# Patient Record
Sex: Male | Born: 1955 | Race: White | Hispanic: No | Marital: Married | Smoking: Current every day smoker
Health system: Southern US, Community
[De-identification: ages and names within clinical notes are randomized; demographics above are authoritative.]

## PROBLEM LIST (undated history)

## (undated) DIAGNOSIS — G43909 Migraine, unspecified, not intractable, without status migrainosus: Secondary | ICD-10-CM

## (undated) DIAGNOSIS — B019 Varicella without complication: Secondary | ICD-10-CM

## (undated) HISTORY — DX: Varicella without complication: B01.9

## (undated) HISTORY — PX: APPENDECTOMY: SHX54

## (undated) HISTORY — DX: Migraine, unspecified, not intractable, without status migrainosus: G43.909

## (undated) HISTORY — PX: INNER EAR SURGERY: SHX679

---

## 2016-04-27 HISTORY — PX: CATARACT EXTRACTION: SUR2

## 2018-02-04 ENCOUNTER — Telehealth: Payer: Self-pay

## 2018-02-04 NOTE — Telephone Encounter (Signed)
Copied from CRM [id number]. Topic: Appointment Scheduling - Scheduling Inquiry for Clinic >> Feb 04, 2018  9:13 AM Jeff Sheppard wrote: Reason for CRM: Jeff Sheppard, pt of Dr. Patsy Lager, is wanting to know if Jeff Sheppard can establish care with Dr. Patsy Lager as well. She states he has not had a doctor in 30 years other than cataract surgery last year. He is needing to establish a PCP. Non MCR pt as he is still working but she cannot remember insurance provider. Please advise.

## 2018-02-04 NOTE — Telephone Encounter (Signed)
Please advise 

## 2018-02-04 NOTE — Telephone Encounter (Signed)
Please schedule NP appt at his convenience. Thank you.  

## 2018-02-04 NOTE — Telephone Encounter (Signed)
Yes, I will see him.

## 2018-02-07 NOTE — Telephone Encounter (Signed)
Pt scheduled 05/26/18 @ 9:30am

## 2018-05-21 NOTE — Progress Notes (Addendum)
[facility] [address], [address] [place], [place] [id number] [phone] [phone]  Date:  05/26/2018   Name:  Jeff Sheppard   DOB:  1956-04-25   MRN:  [id number]  PCP:  Pearline Cablesopland, Jailee Jaquez C, MD    Chief Complaint: New Patient (Initial Visit) (left side pain,left  arm numbness, ekg normal at ER,  limited ROM, started in october)   History of Present Illness:  Jeff Sheppard is a 63 y.o. very pleasant male patient who presents with the following:  Here today as a new patient.  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  He was at work. He was not having slurred speech.  He did not notice any other symptoms, but the left arm numbness continued for some days.  Since then he has noted some difficulty in moving and lifting his arm 2 weeks ago he was having "a migraine"- he had a bad HA and was nauseated. He went to work; he complained of his left arm being numb.  His work called EMS and they did an EKG; he declined to go to the hospital however at that time  Today he notes that his left arm has been numb and somewhat weak since this past October  He has not noted any other neurologic symptoms, no slurred speech or facial  drooping.  He is not aware of any family history of stroke He does smoke; has smoked since he was a child, about a pack per day since he was maybe 63 yo per his report   There are no active problems to display for this patient.   Past Medical History:  Diagnosis Date  . Chicken pox   . Migraines     Past Surgical History:  Procedure Laterality Date  . APPENDECTOMY     30 years ago  . CATARACT EXTRACTION  2018  . INNER EAR SURGERY     pt states he could not here    Social History   Tobacco Use  . Smoking status: Current Every Day Smoker    Packs/day: 1.00    Types: Cigarettes  . Smokeless tobacco: Never Used  Substance Use Topics  . Alcohol use: Never    Frequency: Never  . Drug use: Never    Family History  Problem Relation Age of Onset  . Cancer Mother   . Breast cancer Mother   . Early death Mother     Not on File  Medication list has been reviewed and updated.  No current outpatient medications on file prior to visit.   No current facility-administered medications on  file prior to visit.     Review of Systems:  As per HPI- otherwise negative.   Physical Examination: Vitals:   05/26/18 0928  BP: 136/90  Pulse: 76  Resp: 16  Temp: 98.7 F (37.1 C)  SpO2: 97%   Vitals:   05/26/18 0928  Weight: 149 lb (67.6 kg)  Height: 5\' 7"  (1.702 m)   Body mass index is 23.34 kg/m. Ideal Body Weight: Weight in (lb) to have BMI = 25: 159.3  GEN: WDWN, NAD, Non-toxic, A & O x 3, looks well, normal weight HEENT: Atraumatic, Normocephalic. Neck supple. No masses, No LAD.  Bilateral TM wnl, oropharynx normal.  PEERL,EOMI.   No carotid bruit, he is missing many teeth Ears and Nose: No external deformity. CV: RRR, No M/G/R. No JVD. No thrill. No extra heart sounds. PULM: CTA B, no wheezes, crackles, rhonchi. No retractions. No resp. distress. No accessory muscle use. ABD: S, NT, ND EXTR: No c/c/e NEURO Normal gait.  Normal Romberg, tandem stance is  normal He notes normal strength and sensation of his face.  Deep tendon reflexes of bilateral biceps and patella are normal.  He has slightly decreased grip strength of the left hand compared with the right.  He does have loss of range of motion the left arm, but I am not sure if this is due to adhesive capsulitis.  I am not able to move his arm passively through full range of motion, he has restriction to abduction and flexion.  His sensation is normal in all limbs at this time [facility]: Normally interactive. Conversant. Not depressed or anxious appearing.  Calm demeanor.   EKG: Mild sinus bradycardia with rate of 56.  Right bundle branch block is present.  No old EKG for comparison available  Assessment and Plan: Left arm weakness - Plan: EKG 12-Lead, Tdap vaccine greater than or equal to 7yo IM  Screening for prostate cancer - Plan: PSA  Screening for colon cancer  Screening for diabetes mellitus - Plan: Comprehensive metabolic panel, Hemoglobin A1c  Screening for hyperlipidemia - Plan: Lipid panel  Screening for deficiency anemia - Plan: CBC  Immunization due  Jeff Sheppard is here today as a new patient.  He has not been to a doctor in many years.  He has been a smoker unfortunately since he was a young child, and has nearly a 60-pack-year history.  He describes an episode in October of last year, when his left arm became numb and weak.  He did not seek medical attention at that time.  He is still having difficulty moving his left arm, but at this point he may just have adhesive capsulitis.  This could have resulted from temporary weakness due to likely stroke.  Plan an MRI of his brain to determine if he had a stroke.  He is advised to seek attention if any other symptoms come up in the meantime. Encouraged him to quit smoking  Signed Abbe Amsterdam, MD  Received his labs as below-letter to patient  Results for orders placed or performed in visit on 05/26/18  CBC  Result Value Ref  Range   WBC 7.7 4.0 - 10.5 K/uL   RBC 5.43 4.22 - 5.81 Mil/uL   Platelets 252.0 150.0 - 400.0 K/uL   Hemoglobin 16.2 13.0 - 17.0 g/dL   HCT 00.9 38.1 - 82.9 %   MCV 88.4 78.0 - 100.0 fl   MCHC 33.8 30.0 - 36.0 g/dL   RDW 93.7 16.9 - 67.8 %  Comprehensive  metabolic panel  Result Value Ref Range   Sodium 139 135 - 145 mEq/L   Potassium 4.1 3.5 - 5.1 mEq/L   Chloride 104 96 - 112 mEq/L   CO2 27 19 - 32 mEq/L   Glucose, Bld 86 70 - 99 mg/dL   BUN 18 6 - 23 mg/dL   Creatinine, Ser 5.281.01 0.40 - 1.50 mg/dL   Total Bilirubin 0.4 0.2 - 1.2 mg/dL   Alkaline Phosphatase 53 39 - 117 U/L   AST 16 0 - 37 U/L   ALT 14 0 - 53 U/L   Total Protein 7.3 6.0 - 8.3 g/dL   Albumin 4.5 3.5 - 5.2 g/dL   Calcium 9.9 8.4 - 41.310.5 mg/dL   GFR 24.4074.56 >10.27>60.00 mL/min  Hemoglobin A1c  Result Value Ref Range   Hgb A1c MFr Bld 5.9 4.6 - 6.5 %  Lipid panel  Result Value Ref Range   Cholesterol 161 0 - 200 mg/dL   Triglycerides 25.348.0 0.0 - 149.0 mg/dL   HDL 66.4444.90 >03.47>39.00 mg/dL   VLDL 9.6 0.0 - 42.540.0 mg/dL   LDL Cholesterol 956106 (H) 0 - 99 mg/dL   Total CHOL/HDL Ratio 4    NonHDL 115.88   PSA  Result Value Ref Range   PSA 2.77 0.10 - 4.00 ng/mL   Called and spoke with his wife, I would suggest a cholesterol medication and baby aspirin.  Send in a prescription for simvastatin 20

## 2018-05-26 ENCOUNTER — Encounter: Payer: Self-pay | Admitting: Family Medicine

## 2018-05-26 ENCOUNTER — Ambulatory Visit (INDEPENDENT_AMBULATORY_CARE_PROVIDER_SITE_OTHER): Payer: PRIVATE HEALTH INSURANCE | Admitting: Family Medicine

## 2018-05-26 VITALS — BP 136/90 | HR 76 | Temp 98.7°F | Resp 16 | Ht 67.0 in | Wt 149.0 lb

## 2018-05-26 DIAGNOSIS — Z13220 Encounter for screening for lipoid disorders: Secondary | ICD-10-CM

## 2018-05-26 DIAGNOSIS — R29898 Other symptoms and signs involving the musculoskeletal system: Secondary | ICD-10-CM | POA: Diagnosis not present

## 2018-05-26 DIAGNOSIS — Z1211 Encounter for screening for malignant neoplasm of colon: Secondary | ICD-10-CM | POA: Diagnosis not present

## 2018-05-26 DIAGNOSIS — Z130 Encounter for screening for diseases of the blood and blood-forming organs and certain disorders involving the immune mechanism: Secondary | ICD-10-CM | POA: Diagnosis not present

## 2018-05-26 DIAGNOSIS — E785 Hyperlipidemia, unspecified: Secondary | ICD-10-CM

## 2018-05-26 DIAGNOSIS — Z125 Encounter for screening for malignant neoplasm of prostate: Secondary | ICD-10-CM

## 2018-05-26 DIAGNOSIS — Z23 Encounter for immunization: Secondary | ICD-10-CM

## 2018-05-26 DIAGNOSIS — Z131 Encounter for screening for diabetes mellitus: Secondary | ICD-10-CM | POA: Diagnosis not present

## 2018-05-26 LAB — CBC
HCT: 48 % (ref 39.0–52.0)
Hemoglobin: 16.2 g/dL (ref 13.0–17.0)
MCHC: 33.8 g/dL (ref 30.0–36.0)
MCV: 88.4 fl (ref 78.0–100.0)
Platelets: 252 10*3/uL (ref 150.0–400.0)
RBC: 5.43 Mil/uL (ref 4.22–5.81)
RDW: 13.6 % (ref 11.5–15.5)
WBC: 7.7 10*3/uL (ref 4.0–10.5)

## 2018-05-26 LAB — COMPREHENSIVE METABOLIC PANEL
ALT: 14 U/L (ref 0–53)
AST: 16 U/L (ref 0–37)
Albumin: 4.5 g/dL (ref 3.5–5.2)
Alkaline Phosphatase: 53 U/L (ref 39–117)
BUN: 18 mg/dL (ref 6–23)
CO2: 27 mEq/L (ref 19–32)
Calcium: 9.9 mg/dL (ref 8.4–10.5)
Chloride: 104 mEq/L (ref 96–112)
Creatinine, Ser: 1.01 mg/dL (ref 0.40–1.50)
GFR: 74.56 mL/min (ref >60.00)
GLUCOSE: 86 mg/dL (ref 70–99)
Potassium: 4.1 mEq/L (ref 3.5–5.1)
Sodium: 139 mEq/L (ref 135–145)
TOTAL PROTEIN: 7.3 g/dL (ref 6.0–8.3)
Total Bilirubin: 0.4 mg/dL (ref 0.2–1.2)

## 2018-05-26 LAB — PSA: PSA: 2.77 ng/mL (ref 0.10–4.00)

## 2018-05-26 LAB — LIPID PANEL
Cholesterol: 161 mg/dL (ref 0–200)
HDL: 44.9 mg/dL (ref >39.00)
LDL Cholesterol: 106 mg/dL — ABNORMAL HIGH (ref 0–99)
NonHDL: 115.88
Total CHOL/HDL Ratio: 4
Triglycerides: 48 mg/dL (ref 0.0–149.0)
VLDL: 9.6 mg/dL (ref 0.0–40.0)

## 2018-05-26 LAB — HEMOGLOBIN A1C: Hgb A1c MFr Bld: 5.9 % (ref 4.6–6.5)

## 2018-05-26 MED ORDER — SIMVASTATIN 20 MG PO TABS: 20.0000 mg | ORAL_TABLET | Freq: Every day | ORAL | 3 refills | Status: DC

## 2018-05-26 NOTE — Patient Instructions (Signed)
It was very nice to see you today, thank you for coming in for visit We will get routine labs for you today, and I will be in touch of these results You got a tetanus vaccine today We will set you up for colon cancer screening with a Cologuard test.  This will be sent to your home  I am concerned that your left arm weakness may represent a stroke.  We will arrange for an MRI of your brain to find out for sure.  This is important because if you had one stroke, we will try to prevent you from having another one that could be more serious  I would encourage you to quit smoking, smoking does increase your risk for heart attack or stroke

## 2018-05-26 NOTE — Addendum Note (Signed)
Addended by: Abbe Amsterdam C on: 05/26/2018 02:32 PM   Modules accepted: Orders

## 2018-06-06 ENCOUNTER — Other Ambulatory Visit: Payer: Self-pay | Admitting: Family Medicine

## 2018-06-07 ENCOUNTER — Telehealth: Payer: Self-pay | Admitting: Family Medicine

## 2018-06-07 ENCOUNTER — Ambulatory Visit
Admission: RE | Admit: 2018-06-07 | Discharge: 2018-06-07 | Disposition: A | Discharge status: Home / Self Care | Payer: PRIVATE HEALTH INSURANCE | Source: Ambulatory Visit | Attending: Family Medicine | Admitting: Family Medicine

## 2018-06-07 DIAGNOSIS — R29898 Other symptoms and signs involving the musculoskeletal system: Secondary | ICD-10-CM

## 2018-06-07 NOTE — Telephone Encounter (Signed)
I called [facility]-  He does have some sort of ear implant, they don't know further details about this.   May not be MRI safe Called and talked with Dr. Margo Aye, will change to a Ct head with and without contrast   Called and communicated this update with his wife [name]

## 2018-06-07 NOTE — Telephone Encounter (Signed)
Copied from CRM [id number]. Topic: Quick Communication - See Telephone Encounter >> Jun 07, 2018  8:05 AM Fanny Bien wrote: CRM for notification. See Telephone encounter for: 06/07/18. Pt wife called and stated that pt could not get MRI done because of the metal in his ears. Pt wife would like to know what they should do. Please advise

## 2018-06-14 ENCOUNTER — Other Ambulatory Visit: Payer: Self-pay | Admitting: Family Medicine

## 2018-06-17 ENCOUNTER — Ambulatory Visit
Admission: RE | Admit: 2018-06-17 | Discharge: 2018-06-17 | Disposition: A | Discharge status: Home / Self Care | Payer: PRIVATE HEALTH INSURANCE | Source: Ambulatory Visit | Attending: Family Medicine | Admitting: Family Medicine

## 2018-06-17 DIAGNOSIS — R29898 Other symptoms and signs involving the musculoskeletal system: Secondary | ICD-10-CM

## 2018-06-17 MED ORDER — IOPAMIDOL (ISOVUE-300) INJECTION 61%
75.0000 mL | Freq: Once | INTRAVENOUS | Status: AC | PRN
  Administered 2018-06-17: 75 mL via INTRAVENOUS

## 2018-06-18 ENCOUNTER — Telehealth: Payer: Self-pay | Admitting: Family Medicine

## 2018-06-18 DIAGNOSIS — R29898 Other symptoms and signs involving the musculoskeletal system: Secondary | ICD-10-CM

## 2018-06-18 NOTE — Telephone Encounter (Signed)
Received his recent CT report, as follows  Ct Head W & Wo Contrast  Result Date: 06/17/2018 CLINICAL DATA:  Tinnitus, LEFT arm and face numbness. Suspect stroke 3 months ago. History of inner ear surgery. EXAM: CT HEAD WITHOUT AND WITH CONTRAST TECHNIQUE: Contiguous axial images were obtained from the base of the skull through the vertex without and with intravenous contrast CONTRAST:  26mL ISOVUE-300 IOPAMIDOL (ISOVUE-300) INJECTION 61% COMPARISON:  None. FINDINGS: BRAIN: No intraparenchymal hemorrhage, mass effect nor midline shift. No parenchymal brain volume loss for age. No hydrocephalus. No acute large vascular territory infarcts. No abnormal extra-axial fluid collections. Basal cisterns are patent. VASCULAR: Mild calcific atherosclerosis of the carotid siphons. SKULL: No skull fracture. No significant scalp soft tissue swelling. SINUSES/ORBITS: Trace paranasal sinus mucosal thickening. Mastoid air cells are well aerated.The included ocular globes and orbital contents are non-suspicious. Status post bilateral ocular lens implants. OTHER: Poor dentition. IMPRESSION: Negative CT HEAD with and without contrast for age. Electronically Signed   By: Awilda Metro M.D.   On: 06/17/2018 14:33   Called and discussed results with his wife, who answered the phone We were not able to get an MRI due to a questionable history of possible ear implant, but it is certainly good news that his CT was normal. Of note, there is no comment of any metallic matter in his brain on CT I will refer to neurology for further evaluation of possible previous stroke

## 2018-06-21 ENCOUNTER — Encounter: Payer: Self-pay | Admitting: Neurology

## 2018-08-01 ENCOUNTER — Telehealth: Payer: Self-pay | Admitting: *Deleted

## 2018-08-01 NOTE — Telephone Encounter (Signed)
Received Cologuard Order Cancellation: [id number]; order has been changed to Suspended for Inactivity, the order will be reactivated if patient returns their sample w/i 365 days of the Initial order. [employer] has made several attempts to reach patient by phone and letter unsuccessfully; forwarded to provider/SLS

## 2018-09-05 ENCOUNTER — Ambulatory Visit: Payer: PRIVATE HEALTH INSURANCE | Admitting: Neurology

## 2018-09-26 ENCOUNTER — Ambulatory Visit: Payer: PRIVATE HEALTH INSURANCE | Admitting: Neurology

## 2018-11-04 ENCOUNTER — Ambulatory Visit: Payer: PRIVATE HEALTH INSURANCE | Admitting: Neurology

## 2018-12-05 ENCOUNTER — Ambulatory Visit: Payer: PRIVATE HEALTH INSURANCE | Admitting: Neurology

## 2019-07-31 IMAGING — CT CT HEAD WO/W CM
1 of 2 series · 13 of 30 positions shown, 17 images · IV contrast (iopamidol)
Comparison: None.

CLINICAL DATA: Tinnitus, LEFT arm and face numbness. Suspect stroke
3 months ago. History of inner ear surgery.

EXAM:
CT HEAD WITHOUT AND WITH CONTRAST
TECHNIQUE: Contiguous axial images were obtained from the base of the skull
through the vertex without and with intravenous contrast
CONTRAST:  75mL XF0EOT-FII IOPAMIDOL (XF0EOT-FII) INJECTION 61%

[Series 2: head w/(date) · axial · 0.42mm/px · z∈[-170,-15]mm · 13 of 37 slices shown, 17 images]
[im 3/37  brain]
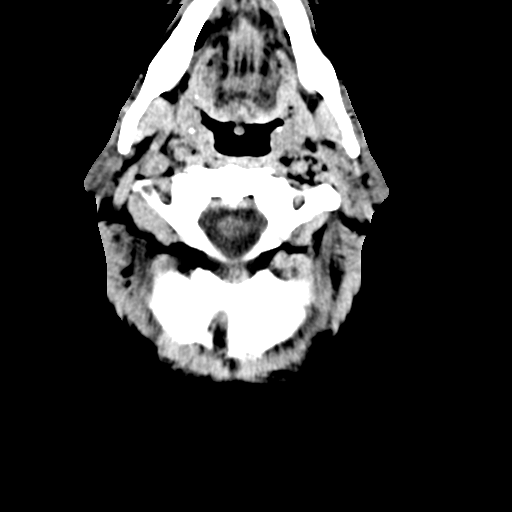
[im 3/37  bone]
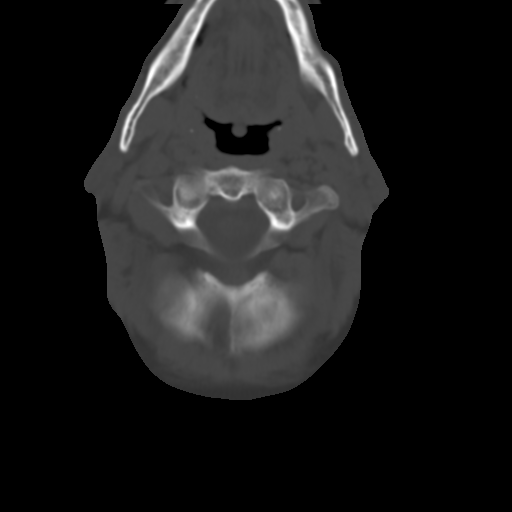
[im 6/37  brain]
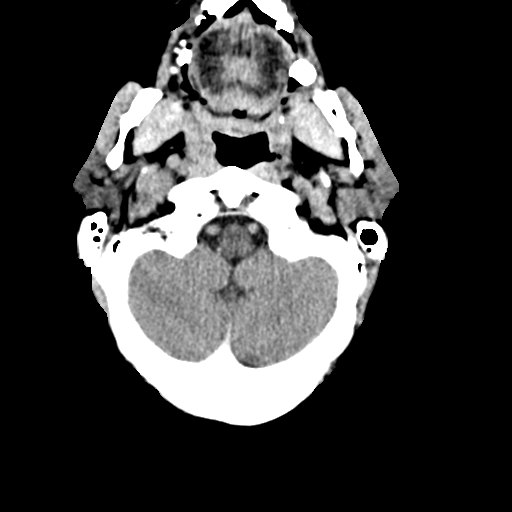
[im 8/37  brain]
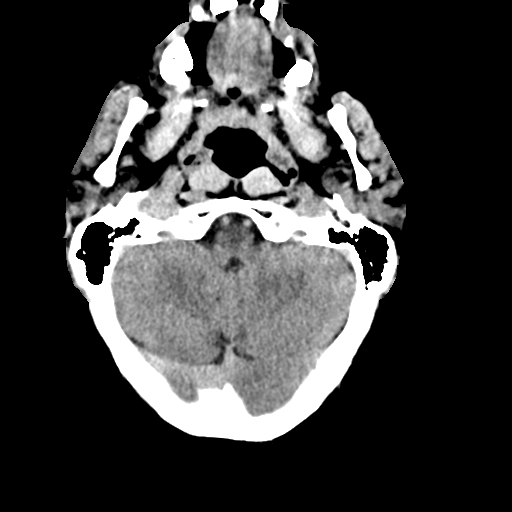
[im 11/37  brain]
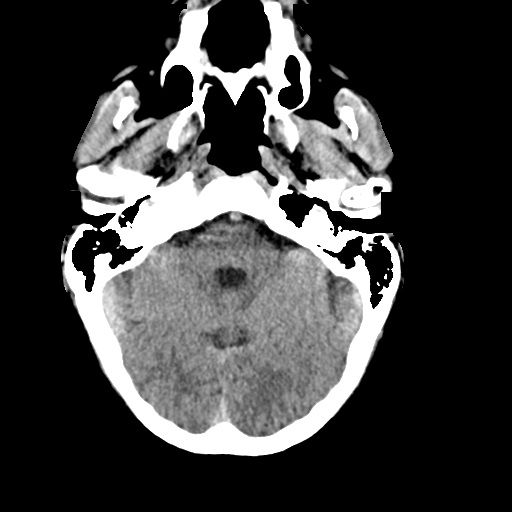
[im 13/37  brain]
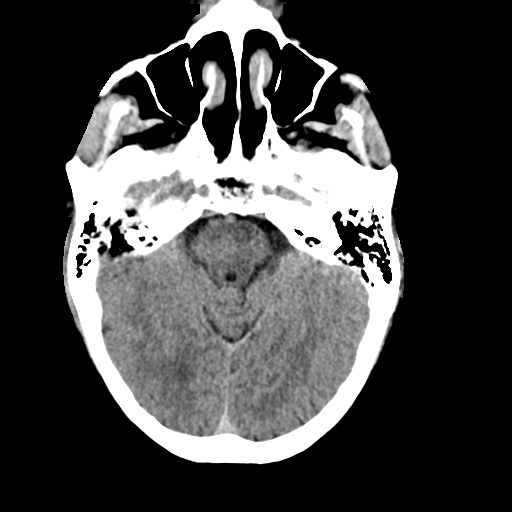
[im 13/37  bone]
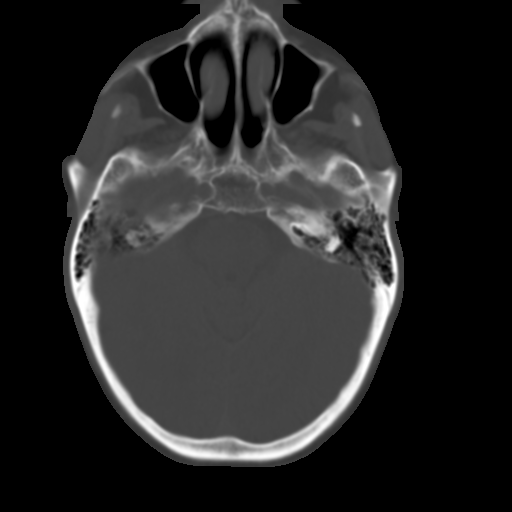
[im 16/37  brain]
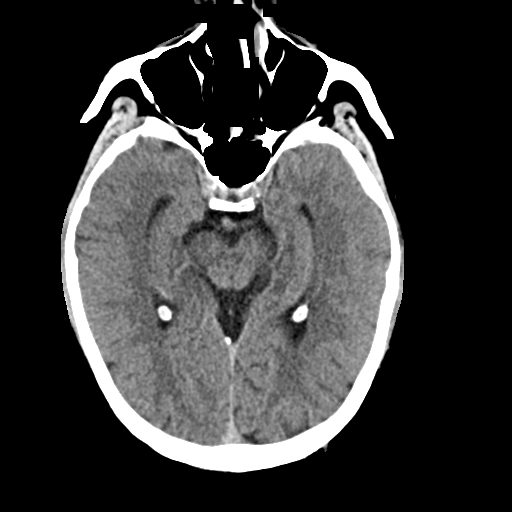
[im 19/37  brain]
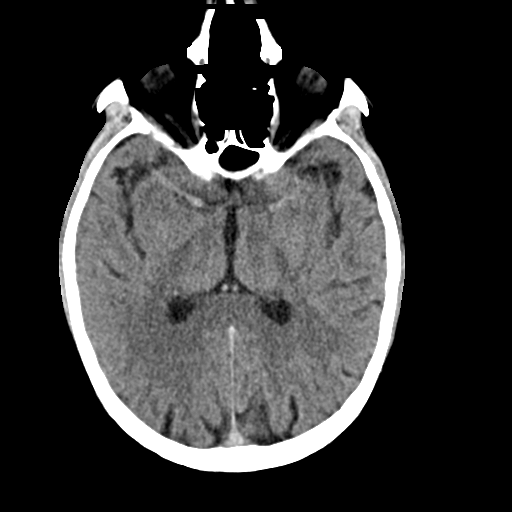
[im 21/37  brain]
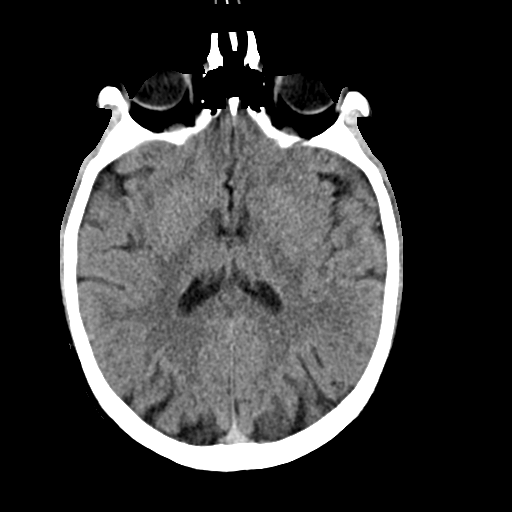
[im 24/37  brain]
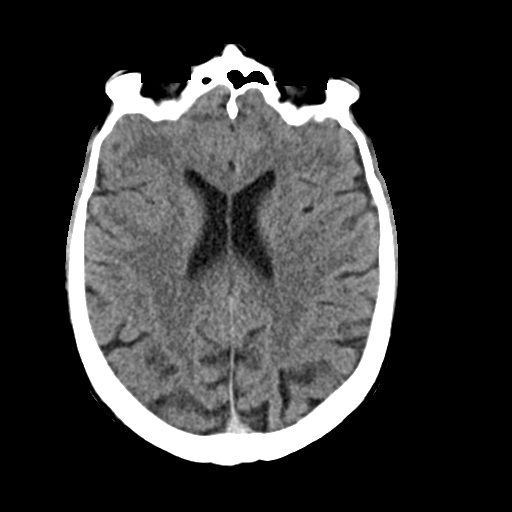
[im 24/37  bone]
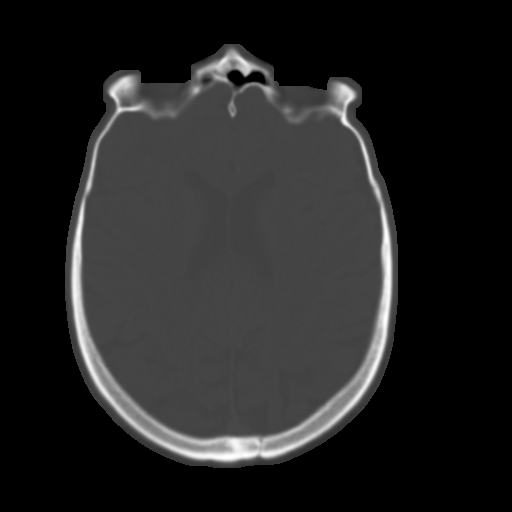
[im 26/37  brain]
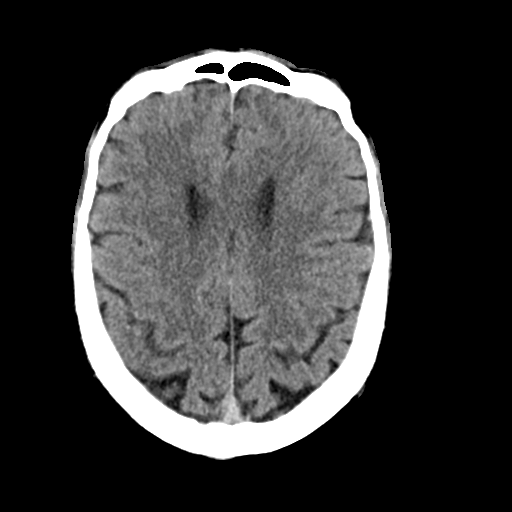
[im 29/37  brain]
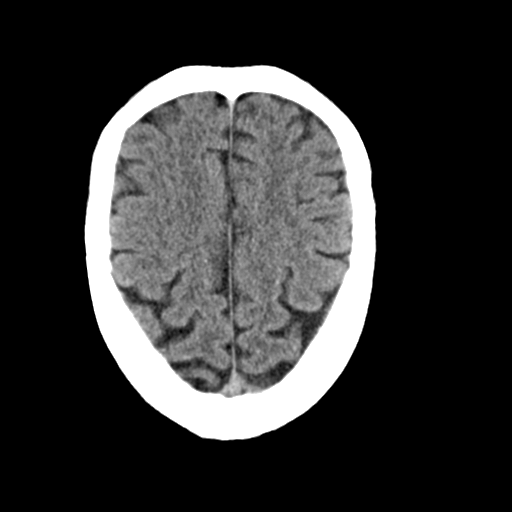
[im 31/37  brain]
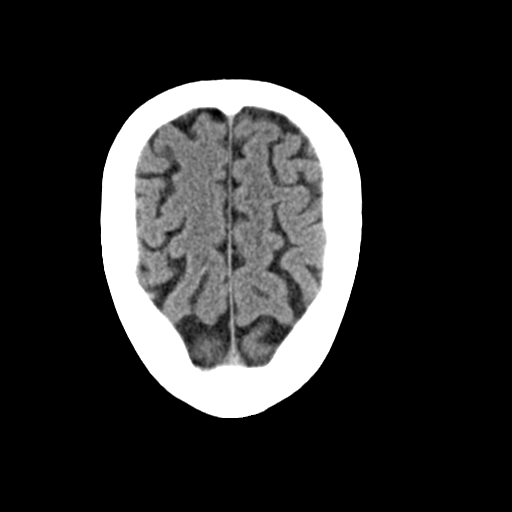
[im 34/37  brain]
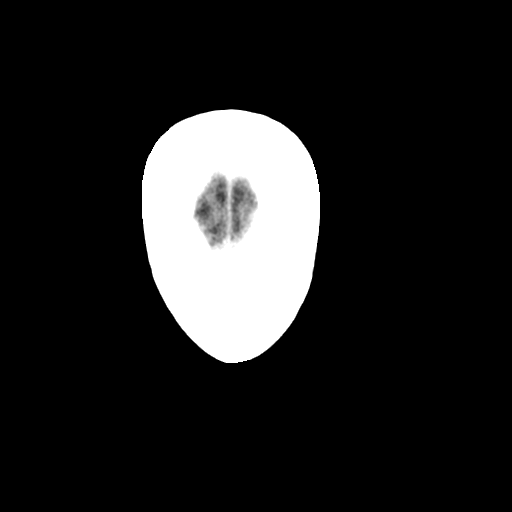
[im 34/37  bone]
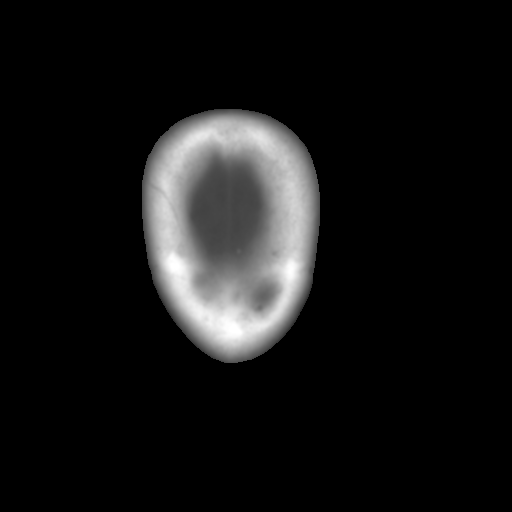

[13 of 30 positions shown; findings below may reference images not displayed]

FINDINGS: BRAIN: No intraparenchymal hemorrhage, mass effect nor midline
shift. No parenchymal brain volume loss for age. No hydrocephalus.
No acute large vascular territory infarcts. No abnormal extra-axial
fluid collections. Basal cisterns are patent.

VASCULAR: Mild calcific atherosclerosis of the carotid siphons.

SKULL: No skull fracture. No significant scalp soft tissue swelling.

SINUSES/ORBITS: Trace paranasal sinus mucosal thickening. Mastoid
air cells are well aerated.The included ocular globes and orbital
contents are non-suspicious. Status post bilateral ocular lens
implants.

OTHER: Poor dentition.
IMPRESSION: Negative CT HEAD with and without contrast for age.

## 2020-09-25 ENCOUNTER — Telehealth: Payer: Self-pay | Admitting: Family Medicine

## 2020-09-25 NOTE — Telephone Encounter (Signed)
Spoke with pt on the phone- he would like a DNR form.  Will send home with his wife [name] who is here in the office today

## 2021-05-27 DIAGNOSIS — G43909 Migraine, unspecified, not intractable, without status migrainosus: Secondary | ICD-10-CM | POA: Diagnosis not present

## 2021-05-27 DIAGNOSIS — Z7982 Long term (current) use of aspirin: Secondary | ICD-10-CM | POA: Diagnosis not present

## 2021-05-27 DIAGNOSIS — Z9849 Cataract extraction status, unspecified eye: Secondary | ICD-10-CM | POA: Diagnosis not present

## 2021-05-27 DIAGNOSIS — H9191 Unspecified hearing loss, right ear: Secondary | ICD-10-CM | POA: Diagnosis not present

## 2021-05-27 DIAGNOSIS — E785 Hyperlipidemia, unspecified: Secondary | ICD-10-CM | POA: Diagnosis not present

## 2021-05-27 DIAGNOSIS — F17210 Nicotine dependence, cigarettes, uncomplicated: Secondary | ICD-10-CM | POA: Diagnosis not present

## 2021-05-27 DIAGNOSIS — E663 Overweight: Secondary | ICD-10-CM | POA: Diagnosis not present

## 2021-05-28 ENCOUNTER — Telehealth: Payer: Self-pay | Admitting: Family Medicine

## 2021-05-28 NOTE — Telephone Encounter (Signed)
Okay to re-establish? 

## 2021-05-28 NOTE — Telephone Encounter (Signed)
Patient wife Jeff Sheppard) called to set up appointment for the patient. She was informed that he had just passed the 3 year mark and was no longer considered a patient. She would like to know if [facility] is willing to accept him. Please advise.

## 2021-05-29 NOTE — Telephone Encounter (Signed)
LVM to call back and set up re-establishing appointment.

## 2021-07-10 NOTE — Progress Notes (Addendum)
[facility] conservation officer at [facility] ?[address], [address] ?[place], [place] [address] ?336 [phone] ?Fax [phone] ? ?Date:  07/14/2021  ? ?Name:  Jeff Sheppard   DOB:  1955/12/03   MRN:  [id number] ? ?PCP:  Pearline Cables, MD  ? ? ?Chief Complaint: re-establish care (Concerns/ questions: pt would like to start medication for migraine) ? ? ?History of Present Illness: ? ?Jeff Sheppard is a 66 y.o. very pleasant male patient who presents with the following: ? ?Patient is seen today to reestablish care.  I did see him once in January 2020, I have taken care of his wife Jeff Sheppard for a long time ? 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 ? ?When I last saw him he complained of an episode of arm weakness suspicious for stroke.  We were not able to get an MRI due to concern of possible ear implant, but a CT scan was normal ?I referred him to neurology but I do not think he went ? ?Pt notes he will get migraine headache 3-4x a week ?He has noted migraine HA for close about 5; seems to run in his family ?His sx are photo and phonophobia as well as head pain ?He will have to rest in the dark to resolve sx ?If he can catch the sx in time and lays down to rest this will help ?He is using excedrin as needed ?He may have vomiting some of the time  ?He also may get aura  ?He has tried over-the-counter medications so far ? ?COVID series- done ?Colon cancer screening - pt declines  ?Shingrix ?Needs pneumonia vaccine- give today  ?Can update labs today ?Lung cancer screening- he is interested, will refer ? ?He notes he oftentimes coughs up mucus, may get winded easily with walking.  Suspect he likely has COPD.  He is interested in trying Spiriva inhaler ? ? ?There are no problems to display for this patient. ? ? ?Past Medical History:  ?Diagnosis Date  ? Chicken pox   ? Migraines   ? ? ?Past Surgical History:  ?Procedure Laterality Date  ? APPENDECTOMY    ? 30 years  ago  ? CATARACT EXTRACTION  2018  ? INNER EAR SURGERY    ? pt states he could not here  ? ? ?Social History  ? ?Tobacco Use  ? Smoking status: Every Day  ?  Packs/day: 1.00  ?  Types: Cigarettes  ? Smokeless tobacco: Never  ?Vaping Use  ? Vaping Use: Never used  ?Substance Use Topics  ? Alcohol use: Never  ? Drug use: Never  ? ? ?Family History  ?Problem Relation Age of Onset  ? Cancer Mother   ? Breast cancer Mother   ? Early death Mother   ? ? ?Not on File ? ?Medication list has been reviewed and updated. ? ?No current outpatient medications on file prior to visit.  ? ?No current facility-administered medications on file prior to visit.  ? ? ?Review of Systems: ? ?As per HPI- otherwise negative. ? ? ?Physical Examination: ?Vitals:  ? 07/14/21 1019  ?BP: (!) 142/80  ?Pulse: (!) 50  ?Resp: 18  ?Temp: 97.6 ?F (36.4 ?C)  ?SpO2: 96%  ? ?Vitals:  ? 07/14/21 1019  ?Weight: 165 lb 3.2 oz (74.9 kg)  ?Height: 5\' 7"  (1.702 m)  ? ?Body mass index is 25.87 kg/m?. ?Ideal Body Weight: Weight in (lb) to have BMI =  25: 159.3 ? ?GEN: no acute distress.  Normal weight, chronic smoker ?HEENT: Atraumatic, Normocephalic.  ?Ears and Nose: No external deformity. ?CV: RRR-mildly bradycardic, No M/G/R. No JVD. No thrill. No extra heart sounds. ?PULM: CTA B, no wheezes, crackles, rhonchi. No retractions. No resp. distress. No accessory muscle use. ?ABD: S, NT, ND, +BS. No rebound. No HSM. ?EXTR: No c/c/e ?PSYCH: Normally interactive. Conversant.  ?Present in right ear.  Verbal consent obtained, warm water used to irrigate and remove earwax.  Patient noted improvement clearing, no complications ? ?Assessment and Plan: ?Migraine with aura and without status migrainosus, not intractable - Plan: topiramate (TOPAMAX) 25 MG tablet, Ubrogepant (UBRELVY) 50 MG TABS ? ?Screening for deficiency anemia - Plan: CBC ? ?Screening for prostate cancer - Plan: PSA ? ?Screening for hyperlipidemia - Plan: Lipid panel ? ?Screening for lung cancer - Plan:  Ambulatory Referral Lung Cancer Screening [facility] ? ?Screening for diabetes mellitus - Plan: Comprehensive metabolic panel, Hemoglobin A1c ? ?Immunization due - Plan: Pneumococcal conjugate vaccine 20-valent (Prevnar 20) ? ?Chronic bronchitis, unspecified chronic bronchitis type (HCC) - Plan: tiotropium (SPIRIVA HANDIHALER) 18 MCG inhalation capsule ? ?Encounter for hepatitis C screening test for low risk patient - Plan: Hepatitis C Antibody ? ?Patient seen today for follow-up ?Labs ordered, gave Prevnar ?Referral for lung cancer screening ?We will have him try Spiriva inhaler for COPD, asked him to let me know if helpful ?For chronic migraine we will start on Topamax as a preventative, Ubrelvy for as needed use ? ?Will plan further follow- up pending labs. ? ?Signed ?Abbe Amsterdam, MD ? ?Received labs as below, message to patient ? ?Results for orders placed or performed in visit on 07/14/21  ?CBC  ?Result Value Ref Range  ? WBC 7.2 4.0 - 10.5 K/uL  ? RBC 5.27 4.22 - 5.81 Mil/uL  ? Platelets 209.0 150.0 - 400.0 K/uL  ? Hemoglobin 15.7 13.0 - 17.0 g/dL  ? HCT 46.6 39.0 - 52.0 %  ? MCV 88.3 78.0 - 100.0 fl  ? MCHC 33.7 30.0 - 36.0 g/dL  ? RDW 13.8 11.5 - 15.5 %  ?Comprehensive metabolic panel  ?Result Value Ref Range  ? Sodium 140 135 - 145 mEq/L  ? Potassium 4.4 3.5 - 5.1 mEq/L  ? Chloride 105 96 - 112 mEq/L  ? CO2 27 19 - 32 mEq/L  ? Glucose, Bld 95 70 - 99 mg/dL  ? BUN 20 6 - 23 mg/dL  ? Creatinine, Ser 0.99 0.40 - 1.50 mg/dL  ? Total Bilirubin 0.4 0.2 - 1.2 mg/dL  ? Alkaline Phosphatase 56 39 - 117 U/L  ? AST 18 0 - 37 U/L  ? Sheppard 16 0 - 53 U/L  ? Total Protein 7.4 6.0 - 8.3 g/dL  ? Albumin 4.5 3.5 - 5.2 g/dL  ? GFR 79.47 >60.00 mL/min  ? Calcium 9.6 8.4 - 10.5 mg/dL  ?Hemoglobin A1c  ?Result Value Ref Range  ? Hgb A1c MFr Bld 6.1 4.6 - 6.5 %  ?Lipid panel  ?Result Value Ref Range  ? Cholesterol 182 0 - 200 mg/dL  ? Triglycerides 57.0 0.0 - 149.0 mg/dL  ? HDL 50.90 >39.00 mg/dL  ? VLDL 11.4 0.0 - 40.0  mg/dL  ? LDL Cholesterol 120 (H) 0 - 99 mg/dL  ? Total CHOL/HDL Ratio 4   ? NonHDL 130.91   ?PSA  ?Result Value Ref Range  ? PSA 2.94 0.10 - 4.00 ng/mL  ? ? ? ?

## 2021-07-14 ENCOUNTER — Encounter: Payer: Self-pay | Admitting: Family Medicine

## 2021-07-14 ENCOUNTER — Ambulatory Visit (INDEPENDENT_AMBULATORY_CARE_PROVIDER_SITE_OTHER): Payer: HMO | Admitting: Family Medicine

## 2021-07-14 VITALS — BP 142/80 | HR 50 | Temp 97.6°F | Resp 18 | Ht 67.0 in | Wt 165.2 lb

## 2021-07-14 DIAGNOSIS — Z23 Encounter for immunization: Secondary | ICD-10-CM

## 2021-07-14 DIAGNOSIS — Z13220 Encounter for screening for lipoid disorders: Secondary | ICD-10-CM

## 2021-07-14 DIAGNOSIS — Z1159 Encounter for screening for other viral diseases: Secondary | ICD-10-CM | POA: Diagnosis not present

## 2021-07-14 DIAGNOSIS — R7303 Prediabetes: Secondary | ICD-10-CM

## 2021-07-14 DIAGNOSIS — Z131 Encounter for screening for diabetes mellitus: Secondary | ICD-10-CM

## 2021-07-14 DIAGNOSIS — J42 Unspecified chronic bronchitis: Secondary | ICD-10-CM

## 2021-07-14 DIAGNOSIS — Z130 Encounter for screening for diseases of the blood and blood-forming organs and certain disorders involving the immune mechanism: Secondary | ICD-10-CM

## 2021-07-14 DIAGNOSIS — Z720 Tobacco use: Secondary | ICD-10-CM | POA: Diagnosis not present

## 2021-07-14 DIAGNOSIS — Z125 Encounter for screening for malignant neoplasm of prostate: Secondary | ICD-10-CM | POA: Diagnosis not present

## 2021-07-14 DIAGNOSIS — Z122 Encounter for screening for malignant neoplasm of respiratory organs: Secondary | ICD-10-CM | POA: Diagnosis not present

## 2021-07-14 DIAGNOSIS — G43109 Migraine with aura, not intractable, without status migrainosus: Secondary | ICD-10-CM

## 2021-07-14 DIAGNOSIS — J449 Chronic obstructive pulmonary disease, unspecified: Secondary | ICD-10-CM | POA: Insufficient documentation

## 2021-07-14 LAB — HEMOGLOBIN A1C: Hgb A1c MFr Bld: 6.1 % (ref 4.6–6.5)

## 2021-07-14 LAB — CBC
HCT: 46.6 % (ref 39.0–52.0)
Hemoglobin: 15.7 g/dL (ref 13.0–17.0)
MCHC: 33.7 g/dL (ref 30.0–36.0)
MCV: 88.3 fl (ref 78.0–100.0)
Platelets: 209 10*3/uL (ref 150.0–400.0)
RBC: 5.27 Mil/uL (ref 4.22–5.81)
RDW: 13.8 % (ref 11.5–15.5)
WBC: 7.2 10*3/uL (ref 4.0–10.5)

## 2021-07-14 LAB — COMPREHENSIVE METABOLIC PANEL
ALT: 16 U/L (ref 0–53)
AST: 18 U/L (ref 0–37)
Albumin: 4.5 g/dL (ref 3.5–5.2)
Alkaline Phosphatase: 56 U/L (ref 39–117)
BUN: 20 mg/dL (ref 6–23)
CO2: 27 mEq/L (ref 19–32)
Calcium: 9.6 mg/dL (ref 8.4–10.5)
Chloride: 105 mEq/L (ref 96–112)
Creatinine, Ser: 0.99 mg/dL (ref 0.40–1.50)
GFR: 79.47 mL/min (ref >60.00)
Glucose, Bld: 95 mg/dL (ref 70–99)
Potassium: 4.4 mEq/L (ref 3.5–5.1)
Sodium: 140 mEq/L (ref 135–145)
Total Bilirubin: 0.4 mg/dL (ref 0.2–1.2)
Total Protein: 7.4 g/dL (ref 6.0–8.3)

## 2021-07-14 LAB — LIPID PANEL
Cholesterol: 182 mg/dL (ref 0–200)
HDL: 50.9 mg/dL (ref >39.00)
LDL Cholesterol: 120 mg/dL — ABNORMAL HIGH (ref 0–99)
NonHDL: 130.91
Total CHOL/HDL Ratio: 4
Triglycerides: 57 mg/dL (ref 0.0–149.0)
VLDL: 11.4 mg/dL (ref 0.0–40.0)

## 2021-07-14 LAB — PSA: PSA: 2.94 ng/mL (ref 0.10–4.00)

## 2021-07-14 MED ORDER — TOPIRAMATE 25 MG PO TABS: 25.0000 mg | ORAL_TABLET | Freq: Two times a day (BID) | ORAL | 3 refills | Status: DC

## 2021-07-14 MED ORDER — SPIRIVA HANDIHALER 18 MCG IN CAPS: 18.0000 ug | ORAL_CAPSULE | Freq: Every day | RESPIRATORY_TRACT | 12 refills | Status: DC

## 2021-07-14 MED ORDER — UBRELVY 50 MG PO TABS: ORAL_TABLET | ORAL | 3 refills | Status: DC

## 2021-07-14 NOTE — Patient Instructions (Addendum)
It was very nice to see you again today, I will be in touch with your labs as soon as possible ? ?We will work on getting you set up for lung cancer screening CT scan ? ?I suspect you have COPD causing some shortness of breath and chronic bronchitis.  Try adding Spiriva inhaler once a day ? ?For migraine headaches, we will try Topamax to prevent headache from occurring.  Please take 25 mg at bedtime only for 1 week, then increase to twice a day.  We can increase the dosage further if need be ? ?We will also have you try Roselyn Meier as needed when the headache has already occurred- try to take at the first sign of headache  ? ?Pneumonia vaccine today.  I would also suggest getting the COVID-19 Bivalent booster ? ?Please see me in about 3 months so we can check on how you are doing!   ?

## 2021-07-15 LAB — HEPATITIS C ANTIBODY
Hepatitis C Ab: NONREACTIVE
SIGNAL TO CUT-OFF: <0.02 (ref <1.00)

## 2021-08-12 ENCOUNTER — Telehealth: Payer: Self-pay | Admitting: Family Medicine

## 2021-08-12 DIAGNOSIS — Z122 Encounter for screening for malignant neoplasm of respiratory organs: Secondary | ICD-10-CM

## 2021-08-12 NOTE — Telephone Encounter (Signed)
Ref was placed to Pulm on 07/14/21- should we try the other way? ?

## 2021-08-12 NOTE — Telephone Encounter (Signed)
Thanks [place]- I ordered the lung CT myself just now. Can you please call Margie and let her know- thank you  ?

## 2021-08-12 NOTE — Telephone Encounter (Signed)
Pt's wife stated that [facility] was due for a Chest CT by Dr. Patsy Lager but that imaging downstairs didn't have an order for it. She is wondering if Dr. Patsy Lager might have forgotten to put in for it. Please Advise. ?

## 2021-08-12 NOTE — Telephone Encounter (Signed)
Jeff Sheppard is aware

## 2021-08-22 ENCOUNTER — Ambulatory Visit (HOSPITAL_BASED_OUTPATIENT_CLINIC_OR_DEPARTMENT_OTHER)
Admission: RE | Admit: 2021-08-22 | Discharge: 2021-08-22 | Disposition: A | Discharge status: Home / Self Care | Payer: PPO | Source: Ambulatory Visit | Attending: Family Medicine | Admitting: Family Medicine

## 2021-08-22 DIAGNOSIS — R911 Solitary pulmonary nodule: Secondary | ICD-10-CM | POA: Diagnosis not present

## 2021-08-22 DIAGNOSIS — J439 Emphysema, unspecified: Secondary | ICD-10-CM | POA: Insufficient documentation

## 2021-08-22 DIAGNOSIS — I700 Atherosclerosis of aorta: Secondary | ICD-10-CM | POA: Diagnosis not present

## 2021-08-22 DIAGNOSIS — F17210 Nicotine dependence, cigarettes, uncomplicated: Secondary | ICD-10-CM | POA: Diagnosis not present

## 2021-08-22 DIAGNOSIS — Z122 Encounter for screening for malignant neoplasm of respiratory organs: Secondary | ICD-10-CM | POA: Diagnosis not present

## 2021-08-25 ENCOUNTER — Other Ambulatory Visit: Payer: Self-pay | Admitting: Family Medicine

## 2021-08-25 ENCOUNTER — Encounter: Payer: Self-pay | Admitting: Family Medicine

## 2021-08-25 DIAGNOSIS — R911 Solitary pulmonary nodule: Secondary | ICD-10-CM

## 2021-08-25 NOTE — Progress Notes (Signed)
Received CT report- recommend recheck in 6 months ?COMPARISON:  None. ?  ?FINDINGS: ?Cardiovascular: Aortic atherosclerosis. Normal heart size, without ?pericardial effusion. ?  ?Mediastinum/Nodes: No mediastinal or definite hilar adenopathy, ?given limitations of unenhanced CT. ?  ?Lungs/Pleura: Mild centrilobular emphysema. Right lung base scarring ?laterally. Pulmonary nodules, the largest of which is in the right ?upper lobe at volume derived equivalent diameter 7.0 mm including on ?image 75/3. ?  ?Upper Abdomen: Normal imaged portions of the liver, spleen, stomach, ?gallbladder, adrenal glands, kidneys. Suspect mild fatty replacement ?within the pancreatic head and body, with relative subtle ?hypoattenuation. ?  ?Musculoskeletal: No acute osseous abnormality. ?  ?IMPRESSION: ?Lung-RADS 3, probably benign findings. Short-term follow-up in 6 ?months is recommended with repeat low-dose chest CT without contrast (please use the following order, "CT CHEST LCS NODULE FOLLOW-UP W/O CM"). Right upper lobe pulmonary nodule of volume derived equivalent diameter 7.0 mm. ?  ?Aortic Atherosclerosis (ICD10-I70.0) and Emphysema (ICD10-J43.9). ?  ?Ordered CT, letter to pt as well as mychart message  ? ? ?

## 2021-10-02 NOTE — Progress Notes (Unsigned)
[place] [facility] [address] [place], [place] [address] [phone]  Date:  10/06/2021   Name:  Jeff Sheppard   DOB:  12-11-1955   MRN:  [id number]  PCP:  Darreld Mclean, MD    Chief Complaint: No chief complaint on file.   History of Present Illness:  Jeff Sheppard is a 66 y.o. very pleasant male patient who presents with the following:  Patient seen today for follow-up visit Most recently seen by myself in March History of long-term severe tobacco use, prediabetes, COPD At her last visit he had concern of chronic migraine.  I started him on Topamax as preventative, Ubrelvy as needed We also started Spiriva for COPD symptoms  Lung cancer screening CT last month revealed a nodule which needs 68-month follow-up Lab work done in March looked okay, A1c 6.1  Colon cancer screening Sh[facility] Patient Active Problem List   Diagnosis Date Noted   Prediabetes 07/14/2021   Tobacco abuse 07/14/2021   COPD (chronic obstructive pulmonary disease) ([place]) 07/14/2021    Past Medical History:  Diagnosis Date   Chicken pox    Migraines     Past Surgical History:  Procedure Laterality Date   APPENDECTOMY     30 years ago   CATARACT EXTRACTION  2018   INNER EAR SURGERY     pt states he could not here    Social History   Tobacco Use   Smoking status: Every Day    Packs/day: 1.00    Types: Cigarettes   Smokeless tobacco: Never  Vaping Use   Vaping Use: Never used  Substance Use Topics   Alcohol use: Never   Drug use: Never    Family History  Problem Relation Age of Onset   Cancer Mother    Breast cancer Mother    Early death Mother     Not on File  Medication list has been reviewed and updated.  Current Outpatient Medications on File Prior to Visit  Medication Sig Dispense Refill   tiotropium (SPIRIVA HANDIHALER) 18 MCG inhalation capsule Place 1 capsule (18 mcg total) into inhaler and inhale  daily. 30 capsule 12   topiramate (TOPAMAX) 25 MG tablet Take 1 tablet (25 mg total) by mouth 2 (two) times daily. For migraine prevention 60 tablet 3   Ubrogepant (UBRELVY) 50 MG TABS Take 50 mg as needed for migraine headache.  May repeat in one hour- max 200mg /24 hours 30 tablet 3   No current facility-administered medications on file prior to visit.    Review of Systems:  As per HPI- otherwise negative.   Physical Examination: There were no vitals filed for this visit. There were no vitals filed for this visit. There is no height or weight on file to calculate BMI. Ideal Body Weight:    GEN: no acute distress. HEENT: Atraumatic, Normocephalic.  Ears and Nose: No external deformity. CV: RRR, No M/G/R. No JVD. No thrill. No extra heart sounds. PULM: CTA B, no wheezes, crackles, rhonchi. No retractions. No resp. distress. No accessory muscle use. ABD: S, NT, ND, +BS. No rebound. No HSM. EXTR: No c/c/e PSYCH: Normally interactive. Conversant.    Assessment and Plan: ***  Signed Lamar Blinks, MD

## 2021-10-06 ENCOUNTER — Ambulatory Visit (INDEPENDENT_AMBULATORY_CARE_PROVIDER_SITE_OTHER): Payer: HMO | Admitting: Family Medicine

## 2021-10-06 VITALS — BP 110/60 | HR 37 | Temp 97.6°F | Resp 18 | Ht 67.0 in | Wt 161.0 lb

## 2021-10-06 DIAGNOSIS — R9431 Abnormal electrocardiogram [ECG] [EKG]: Secondary | ICD-10-CM

## 2021-10-06 DIAGNOSIS — G43109 Migraine with aura, not intractable, without status migrainosus: Secondary | ICD-10-CM

## 2021-10-06 DIAGNOSIS — R7303 Prediabetes: Secondary | ICD-10-CM

## 2021-10-06 DIAGNOSIS — R001 Bradycardia, unspecified: Secondary | ICD-10-CM

## 2021-10-06 DIAGNOSIS — R0989 Other specified symptoms and signs involving the circulatory and respiratory systems: Secondary | ICD-10-CM

## 2021-10-06 MED ORDER — IPRATROPIUM BROMIDE 0.03 % NA SOLN: 2.0000 | Freq: Three times a day (TID) | NASAL | 12 refills | Status: DC

## 2021-10-06 NOTE — Patient Instructions (Addendum)
It was good to see you again today Please stop by imaging on the ground floor and set up your repeat CT chest - should be done in late October or November Your EKG shows some changes from previous- I am going to set up an echocardiogram for you and also get you seen by a heart doctor Please let me know if you have any chest pain or other concerns!   See me in 6 months assuming all is ok  Atrovent nasal spray as needed for chest congestion

## 2021-10-20 ENCOUNTER — Inpatient Hospital Stay (HOSPITAL_COMMUNITY)
Admission: AD | Admit: 2021-10-20 | Discharge: 2021-10-22 | DRG: 244 | Disposition: A | Discharge status: Home / Self Care | Payer: PPO | Source: Ambulatory Visit | Attending: Cardiovascular Disease | Admitting: Cardiovascular Disease

## 2021-10-20 ENCOUNTER — Encounter: Payer: Self-pay | Admitting: Student

## 2021-10-20 ENCOUNTER — Ambulatory Visit (HOSPITAL_BASED_OUTPATIENT_CLINIC_OR_DEPARTMENT_OTHER): Payer: PPO

## 2021-10-20 ENCOUNTER — Other Ambulatory Visit: Payer: Self-pay

## 2021-10-20 ENCOUNTER — Ambulatory Visit (INDEPENDENT_AMBULATORY_CARE_PROVIDER_SITE_OTHER): Payer: HMO | Admitting: Cardiovascular Disease

## 2021-10-20 ENCOUNTER — Encounter (HOSPITAL_COMMUNITY): Payer: Self-pay

## 2021-10-20 VITALS — BP 110/60 | HR 38

## 2021-10-20 DIAGNOSIS — R001 Bradycardia, unspecified: Secondary | ICD-10-CM | POA: Diagnosis not present

## 2021-10-20 DIAGNOSIS — I4510 Unspecified right bundle-branch block: Secondary | ICD-10-CM | POA: Diagnosis present

## 2021-10-20 DIAGNOSIS — Z803 Family history of malignant neoplasm of breast: Secondary | ICD-10-CM

## 2021-10-20 DIAGNOSIS — R9431 Abnormal electrocardiogram [ECG] [EKG]: Secondary | ICD-10-CM | POA: Diagnosis not present

## 2021-10-20 DIAGNOSIS — J449 Chronic obstructive pulmonary disease, unspecified: Secondary | ICD-10-CM | POA: Diagnosis not present

## 2021-10-20 DIAGNOSIS — Z87891 Personal history of nicotine dependence: Secondary | ICD-10-CM

## 2021-10-20 DIAGNOSIS — Z950 Presence of cardiac pacemaker: Secondary | ICD-10-CM | POA: Diagnosis not present

## 2021-10-20 DIAGNOSIS — I441 Atrioventricular block, second degree: Principal | ICD-10-CM | POA: Diagnosis present

## 2021-10-20 DIAGNOSIS — I442 Atrioventricular block, complete: Principal | ICD-10-CM | POA: Diagnosis present

## 2021-10-20 DIAGNOSIS — Z79899 Other long term (current) drug therapy: Secondary | ICD-10-CM | POA: Diagnosis not present

## 2021-10-20 DIAGNOSIS — G43909 Migraine, unspecified, not intractable, without status migrainosus: Secondary | ICD-10-CM | POA: Diagnosis not present

## 2021-10-20 LAB — COMPREHENSIVE METABOLIC PANEL
ALT: 14 U/L (ref 0–44)
AST: 22 U/L (ref 15–41)
Albumin: 3.8 g/dL (ref 3.5–5.0)
Alkaline Phosphatase: 50 U/L (ref 38–126)
Anion gap: 11 (ref 5–15)
BUN: 16 mg/dL (ref 8–23)
CO2: 20 mmol/L — ABNORMAL LOW (ref 22–32)
Calcium: 9.2 mg/dL (ref 8.9–10.3)
Chloride: 108 mmol/L (ref 98–111)
Creatinine, Ser: 1.05 mg/dL (ref 0.61–1.24)
GFR, Estimated: >60 mL/min (ref >60)
Glucose, Bld: 89 mg/dL (ref 70–99)
Potassium: 3.8 mmol/L (ref 3.5–5.1)
Sodium: 139 mmol/L (ref 135–145)
Total Bilirubin: 0.8 mg/dL (ref 0.3–1.2)
Total Protein: 6.7 g/dL (ref 6.5–8.1)

## 2021-10-20 LAB — CBC
HCT: 42.9 % (ref 39.0–52.0)
Hemoglobin: 14.9 g/dL (ref 13.0–17.0)
MCH: 29.7 pg (ref 26.0–34.0)
MCHC: 34.7 g/dL (ref 30.0–36.0)
MCV: 85.5 fL (ref 80.0–100.0)
Platelets: 209 10*3/uL (ref 150–400)
RBC: 5.02 MIL/uL (ref 4.22–5.81)
RDW: 12.9 % (ref 11.5–15.5)
WBC: 7.8 10*3/uL (ref 4.0–10.5)
nRBC: 0 % (ref 0.0–0.2)

## 2021-10-20 LAB — HIV ANTIBODY (ROUTINE TESTING W REFLEX): HIV Screen 4th Generation wRfx: NONREACTIVE

## 2021-10-20 LAB — ECHOCARDIOGRAM COMPLETE
Area-P 1/2: 3.77 cm2
S' Lateral: 3.55 cm

## 2021-10-20 LAB — SURGICAL PCR SCREEN
MRSA, PCR: NEGATIVE
Staphylococcus aureus: NEGATIVE

## 2021-10-20 LAB — MAGNESIUM: Magnesium: 2.1 mg/dL (ref 1.7–2.4)

## 2021-10-20 LAB — TSH: TSH: 1.033 u[IU]/mL (ref 0.350–4.500)

## 2021-10-20 MED ORDER — HEPARIN SODIUM (PORCINE) 5000 UNIT/ML IJ SOLN
5000.0000 [IU] | Freq: Three times a day (TID) | INTRAMUSCULAR | Status: AC
  Administered 2021-10-20: 5000 [IU] via SUBCUTANEOUS
  Filled 2021-10-20: qty 1

## 2021-10-20 MED ORDER — IPRATROPIUM BROMIDE 0.03 % NA SOLN: 2.0000 | Freq: Three times a day (TID) | NASAL | Status: DC | PRN

## 2021-10-20 MED ORDER — TOPIRAMATE 25 MG PO TABS
25.0000 mg | ORAL_TABLET | Freq: Two times a day (BID) | ORAL | Status: DC
  Administered 2021-10-20 – 2021-10-22 (×4): 25 mg via ORAL
  Filled 2021-10-20 (×4): qty 1

## 2021-10-20 MED ORDER — TIOTROPIUM BROMIDE MONOHYDRATE 18 MCG IN CAPS
18.0000 ug | ORAL_CAPSULE | Freq: Every day | RESPIRATORY_TRACT | Status: DC
Start: 2021-10-20 — End: 2021-10-20

## 2021-10-20 MED ORDER — HEPARIN SODIUM (PORCINE) 5000 UNIT/ML IJ SOLN: 5000.0000 [IU] | Freq: Three times a day (TID) | INTRAMUSCULAR | Status: DC

## 2021-10-20 MED ORDER — ACETAMINOPHEN 325 MG PO TABS: 650.0000 mg | ORAL_TABLET | ORAL | Status: DC | PRN

## 2021-10-20 MED ORDER — NITROGLYCERIN 0.4 MG SL SUBL: 0.4000 mg | SUBLINGUAL_TABLET | SUBLINGUAL | Status: DC | PRN

## 2021-10-20 MED ORDER — UMECLIDINIUM BROMIDE 62.5 MCG/ACT IN AEPB
1.0000 | INHALATION_SPRAY | Freq: Every day | RESPIRATORY_TRACT | Status: DC
  Filled 2021-10-20: qty 7

## 2021-10-20 MED ORDER — CHLORHEXIDINE GLUCONATE 4 % EX LIQD
60.0000 mL | Freq: Once | CUTANEOUS | Status: AC
  Administered 2021-10-21: 4 via TOPICAL
  Filled 2021-10-20: qty 60

## 2021-10-20 MED ORDER — ONDANSETRON HCL 4 MG/2ML IJ SOLN: 4.0000 mg | Freq: Four times a day (QID) | INTRAMUSCULAR | Status: DC | PRN

## 2021-10-20 MED ORDER — CHLORHEXIDINE GLUCONATE 4 % EX LIQD
60.0000 mL | Freq: Once | CUTANEOUS | Status: AC
  Filled 2021-10-20: qty 60

## 2021-10-21 ENCOUNTER — Encounter (HOSPITAL_COMMUNITY)
Admission: AD | Disposition: A | Discharge status: Home / Self Care | Payer: Self-pay | Source: Ambulatory Visit | Attending: Cardiovascular Disease

## 2021-10-21 ENCOUNTER — Encounter (HOSPITAL_COMMUNITY): Payer: Self-pay | Admitting: Cardiovascular Disease

## 2021-10-21 DIAGNOSIS — I442 Atrioventricular block, complete: Secondary | ICD-10-CM | POA: Diagnosis not present

## 2021-10-21 HISTORY — PX: PACEMAKER IMPLANT: EP1218

## 2021-10-21 LAB — LIPID PANEL
Cholesterol: 159 mg/dL (ref 0–200)
HDL: 35 mg/dL — ABNORMAL LOW (ref >40)
LDL Cholesterol: 108 mg/dL — ABNORMAL HIGH (ref 0–99)
Total CHOL/HDL Ratio: 4.5 RATIO
Triglycerides: 80 mg/dL (ref <150)
VLDL: 16 mg/dL (ref 0–40)

## 2021-10-21 LAB — ABO/RH: ABO/RH(D): A POS

## 2021-10-21 LAB — TYPE AND SCREEN
ABO/RH(D): A POS
Antibody Screen: NEGATIVE

## 2021-10-21 SURGERY — PACEMAKER IMPLANT

## 2021-10-21 MED ORDER — FENTANYL CITRATE (PF) 100 MCG/2ML IJ SOLN
INTRAMUSCULAR | Status: AC
  Filled 2021-10-21: qty 2

## 2021-10-21 MED ORDER — CEFAZOLIN SODIUM-DEXTROSE 1-4 GM/50ML-% IV SOLN
1.0000 g | Freq: Four times a day (QID) | INTRAVENOUS | Status: AC
  Administered 2021-10-21 – 2021-10-22 (×3): 1 g via INTRAVENOUS
  Filled 2021-10-21 (×3): qty 50

## 2021-10-21 MED ORDER — HEPARIN (PORCINE) IN NACL 1000-0.9 UT/500ML-% IV SOLN
INTRAVENOUS | Status: AC
  Filled 2021-10-21: qty 500

## 2021-10-21 MED ORDER — CEFAZOLIN SODIUM-DEXTROSE 2-4 GM/100ML-% IV SOLN
2.0000 g | INTRAVENOUS | Status: AC
  Administered 2021-10-21: 2 g via INTRAVENOUS
  Filled 2021-10-21: qty 100

## 2021-10-21 MED ORDER — LIDOCAINE HCL (PF) 1 % IJ SOLN
INTRAMUSCULAR | Status: DC | PRN
  Administered 2021-10-21: 60 mL

## 2021-10-21 MED ORDER — CEFAZOLIN SODIUM-DEXTROSE 2-4 GM/100ML-% IV SOLN
INTRAVENOUS | Status: AC
  Filled 2021-10-21: qty 100

## 2021-10-21 MED ORDER — HEPARIN (PORCINE) IN NACL 2-0.9 UNITS/ML
INTRAMUSCULAR | Status: DC | PRN
  Administered 2021-10-21: 500 mL

## 2021-10-21 MED ORDER — SODIUM CHLORIDE 0.9 % IV SOLN: INTRAVENOUS | Status: DC

## 2021-10-21 MED ORDER — ONDANSETRON HCL 4 MG/2ML IJ SOLN: 4.0000 mg | Freq: Four times a day (QID) | INTRAMUSCULAR | Status: DC | PRN

## 2021-10-21 MED ORDER — ACETAMINOPHEN 325 MG PO TABS: 325.0000 mg | ORAL_TABLET | ORAL | Status: DC | PRN

## 2021-10-21 MED ORDER — SODIUM CHLORIDE 0.9 % IV SOLN
80.0000 mg | INTRAVENOUS | Status: AC
  Administered 2021-10-21: 80 mg
  Filled 2021-10-21: qty 2

## 2021-10-21 MED ORDER — MIDAZOLAM HCL 5 MG/5ML IJ SOLN
INTRAMUSCULAR | Status: DC | PRN
  Administered 2021-10-21: 2 mg via INTRAVENOUS
  Administered 2021-10-21: 1 mg via INTRAVENOUS

## 2021-10-21 MED ORDER — SODIUM CHLORIDE 0.9 % IV SOLN
INTRAVENOUS | Status: AC
  Filled 2021-10-21: qty 2

## 2021-10-21 MED ORDER — MIDAZOLAM HCL 5 MG/5ML IJ SOLN
INTRAMUSCULAR | Status: AC
  Filled 2021-10-21: qty 5

## 2021-10-21 MED ORDER — LIDOCAINE HCL (PF) 1 % IJ SOLN
INTRAMUSCULAR | Status: AC
  Filled 2021-10-21: qty 60

## 2021-10-21 MED ORDER — FENTANYL CITRATE (PF) 100 MCG/2ML IJ SOLN
INTRAMUSCULAR | Status: DC | PRN
  Administered 2021-10-21: 25 ug via INTRAVENOUS
  Administered 2021-10-21: 12.5 ug via INTRAVENOUS

## 2021-10-21 SURGICAL SUPPLY — 14 items
CABLE SURGICAL S-101-97-12 (CABLE) ×2 IMPLANT
CATH RIGHTSITE C315HIS02 (CATHETERS) ×1 IMPLANT
CATH SELECT PACE 669182 (CATHETERS) ×1 IMPLANT
CATH SELECT PACE 669183 (CATHETERS) ×1 IMPLANT
LEAD INGEVITY 7841 52 (Lead) ×1 IMPLANT
LEAD SELECT SECURE 3830 383069 (Lead) IMPLANT
PACEMAKER ACCOLADE DR-EL (Pacemaker) ×1 IMPLANT
PAD DEFIB RADIO PHYSIO CONN (PAD) ×2 IMPLANT
SELECT SECURE 3830 383069 (Lead) ×2 IMPLANT
SHEATH 7FR PRELUDE SNAP 13 (SHEATH) ×1 IMPLANT
SHEATH 8FR PRELUDE SNAP 13 (SHEATH) ×1 IMPLANT
SLITTER 6232ADJ (MISCELLANEOUS) ×1 IMPLANT
TRAY PACEMAKER INSERTION (PACKS) ×2 IMPLANT
WIRE HI TORQ VERSACORE-J 145CM (WIRE) ×1 IMPLANT

## 2021-10-21 NOTE — Progress Notes (Addendum)
Electrophysiology Rounding Note  Patient Name: Jeff Sheppard Date of Encounter: 10/21/2021  Primary Cardiologist: None Electrophysiologist: New   Subjective   NAEO. Feeling good this am.   Inpatient Medications    Scheduled Meds:  gentamicin (GARAMYCIN) 80 mg in sodium chloride 0.9 % 500 mL irrigation  80 mg Irrigation On Call   topiramate  25 mg Oral BID   umeclidinium bromide  1 puff Inhalation Daily   Continuous Infusions:  sodium chloride     sodium chloride      ceFAZolin (ANCEF) IV     PRN Meds: acetaminophen, ipratropium, nitroGLYCERIN, ondansetron (ZOFRAN) IV   Vital Signs    Vitals:   10/20/21 1900 10/20/21 2055 10/21/21 0000 10/21/21 0359  BP: (!) 155/77 (!) 162/82 122/70 (!) 116/59  Pulse: (!) 42 (!) 46 (!) 55 (!) 45  Resp: 20 16 17 17   Temp: 97.7 F (36.5 C)  97.6 F (36.4 C) (!) 97.5 F (36.4 C)  TempSrc: Oral  Oral Oral  SpO2: 94% 95% 92% 97%  Weight:      Height:        Intake/Output Summary (Last 24 hours) at 10/21/2021 0654 Last data filed at 10/20/2021 2200 Gross per 24 hour  Intake 480 ml  Output --  Net 480 ml   Filed Weights   10/20/21 1626  Weight: 70.3 kg    Physical Exam    GEN- The patient is well appearing, alert and oriented x 3 today.   Head- normocephalic, atraumatic Eyes-  Sclera clear, conjunctiva pink Ears- hearing intact Oropharynx- clear Neck- supple Lungs- Clear to ausculation bilaterally, normal work of breathing Heart- Regular rate and rhythm, no murmurs, rubs or gallops GI- soft, NT, ND, + BS Extremities- no clubbing or cyanosis. No edema Skin- no rash or lesion Psych- euthymic mood, full affect Neuro- strength and sensation are intact  Labs    CBC Recent Labs    10/20/21 1845  WBC 7.8  HGB 14.9  HCT 42.9  MCV 85.5  PLT 209   Basic Metabolic Panel Recent Labs    95/28/41 1845  NA 139  K 3.8  CL 108  CO2 20*  GLUCOSE 89  BUN 16  CREATININE 1.05  CALCIUM 9.2  MG 2.1   Liver  Function Tests Recent Labs    10/20/21 1845  AST 22  ALT 14  ALKPHOS 50  BILITOT 0.8  PROT 6.7  ALBUMIN 3.8   No results for input(s): "LIPASE", "AMYLASE" in the last 72 hours. Cardiac Enzymes No results for input(s): "CKTOTAL", "CKMB", "CKMBINDEX", "TROPONINI" in the last 72 hours.   Telemetry    Sinus brady as well as periods of second degree block. 2:1 AV block this am (personally reviewed)  Radiology    ECHOCARDIOGRAM COMPLETE  Result Date: 10/20/2021    ECHOCARDIOGRAM REPORT   Patient Name:   Jeff Sheppard Date of Exam: 10/20/2021 Medical Rec #:  [id number]        Height:       67.0 in Accession #:    [id number]       Weight:       161.0 lb Date of Birth:  30-Oct-1955       BSA:          1.844 m Patient Age:    65 years         BP:           110/60 mmHg Patient Gender: M  HR:           39 bpm. Exam Location:  [address] Procedure: 2D Echo, 3D Echo, Cardiac Doppler, Color Doppler and Strain Analysis Indications:    R94.31 Abnormal EKG  History:        Patient has no prior history of Echocardiogram examinations.                 COPD; Risk Factors:Current Smoker and Pre-diabetes.  Sonographer:    Jorje Guild BS, RDCS Referring Phys: [phone] [name]  Sonographer Comments: Technically difficult study due to poor echo windows. IMPRESSIONS  1. Patient in complete heart block . Left ventricular ejection fraction, by estimation, is 60 to 65%. The left ventricle has normal function. The left ventricle has no regional wall motion abnormalities. Left ventricular diastolic parameters are indeterminate. The average left ventricular global longitudinal strain is -28.5 %. The global longitudinal strain is normal.  2. Right ventricular systolic function is normal. The right ventricular size is normal.  3. The mitral valve is normal in structure. No evidence of mitral valve regurgitation. No evidence of mitral stenosis.  4. The aortic valve is normal in structure. Aortic valve  regurgitation is not visualized. No aortic stenosis is present.  5. Aortic dilatation noted. There is mild dilatation of the aortic root.  6. The inferior vena cava is normal in size with greater than 50% respiratory variability, suggesting right atrial pressure of 3 mmHg. FINDINGS  Left Ventricle: Patient in complete heart block. Left ventricular ejection fraction, by estimation, is 60 to 65%. The left ventricle has normal function. The left ventricle has no regional wall motion abnormalities. The average left ventricular global longitudinal strain is -28.5 %. The global longitudinal strain is normal. The left ventricular internal cavity size was normal in size. There is no left ventricular hypertrophy. Left ventricular diastolic parameters are indeterminate. Right Ventricle: The right ventricular size is normal. No increase in right ventricular wall thickness. Right ventricular systolic function is normal. Left Atrium: Left atrial size was normal in size. Right Atrium: Right atrial size was normal in size. Pericardium: There is no evidence of pericardial effusion. Mitral Valve: The mitral valve is normal in structure. No evidence of mitral valve regurgitation. No evidence of mitral valve stenosis. Tricuspid Valve: The tricuspid valve is normal in structure. Tricuspid valve regurgitation is not demonstrated. No evidence of tricuspid stenosis. Aortic Valve: The aortic valve is normal in structure. Aortic valve regurgitation is not visualized. No aortic stenosis is present. Pulmonic Valve: The pulmonic valve was normal in structure. Pulmonic valve regurgitation is not visualized. No evidence of pulmonic stenosis. Aorta: Aortic dilatation noted. There is mild dilatation of the aortic root. Venous: The inferior vena cava is normal in size with greater than 50% respiratory variability, suggesting right atrial pressure of 3 mmHg. IAS/Shunts: No atrial level shunt detected by color flow Doppler.  LEFT VENTRICLE PLAX 2D  LVIDd:         5.00 cm   Diastology LVIDs:         3.55 cm   LV e' medial:    15.80 cm/s LV PW:         1.00 cm   LV E/e' medial:  7.2 LV IVS:        1.00 cm   LV e' lateral:   16.40 cm/s LVOT diam:     2.30 cm   LV E/e' lateral: 7.0 LV SV:         116 LV SV  Index:   63        2D Longitudinal Strain LVOT Area:     4.15 cm  2D Strain GLS (A2C):   -25.1 %                          2D Strain GLS (A3C):   -29.5 %                          2D Strain GLS (A4C):   -30.8 %                          2D Strain GLS Avg:     -28.5 %                           3D Volume EF:                          3D EF:        56 %                          LV EDV:       109 ml                          LV ESV:       48 ml                          LV SV:        61 ml RIGHT VENTRICLE RV Basal diam:  3.70 cm RV S prime:     13.50 cm/s TAPSE (M-mode): 2.4 cm LEFT ATRIUM             Index        RIGHT ATRIUM           Index LA diam:        3.25 cm 1.76 cm/m   RA Pressure: 3.00 mmHg LA Vol (A2C):   49.5 ml 26.84 ml/m  RA Area:     12.50 cm LA Vol (A4C):   40.6 ml 22.02 ml/m  RA Volume:   29.50 ml  16.00 ml/m LA Biplane Vol: 48.0 ml 26.03 ml/m  AORTIC VALVE LVOT Vmax:   135.00 cm/s LVOT Vmean:  83.000 cm/s LVOT VTI:    0.278 m  AORTA Ao Root diam: 3.90 cm Ao Asc diam:  3.70 cm MITRAL VALVE                TRICUSPID VALVE                             Estimated RAP:  3.00 mmHg MV Decel Time: 201 msec MV E velocity: 114.00 cm/s  SHUNTS MV A velocity: 77.60 cm/s   Systemic VTI:  0.28 m MV E/A ratio:  1.47         Systemic Diam: 2.30 cm Charlton Haws MD Electronically signed by Charlton Haws MD Signature Date/Time: 10/20/2021/3:39:21 PM    Final     Patient Profile     Jeff Sheppard is a 66 y.o. male with a hx of tobacco abuse, COPD who is being seen 10/20/2021 for the evaluation of  av block at the request of Dr Eden Emms.  Assessment & Plan    Advanced AV block -Reports of [facility], 2:1 AV block on arrival 2. Symptomatic Bradycardia Without reversible  cause.  Explained risks, benefits, and alternatives to PPM implantation, including but not limited to bleeding, infection, pneumothorax, pericardial effusion, lead dislodgement, heart attack, stroke, or death.  Pt verbalized understanding and agrees to proceed at next available time.   Dr. Ladona Ridgel to see   For questions or updates, please contact [facility] Please consult [email or web address] for contact info under Cardiology/STEMI.  Signed, Graciella Freer, PA-C  10/21/2021, 6:54 AM   EP Attending  Patient seen and examined. Agree with the findings as noted above. The patient presents with a month of worsening Sob and weakness and has been found to have 2:1 AV block and a ventricular rate in the 35-4 range. On exam he is a pleasant 66 yo man, NAD. Lungs are clear and CV reveals a regular brady and ext are warm with no edema. No reversible causes. He does not have anginal symptoms. I have reviewed the indications/risks/benefits/goals/expectations of DDD PM insertion and he wishes to proceed.    Sharlot Gowda Wah Sabic,MD

## 2021-10-22 ENCOUNTER — Encounter (HOSPITAL_COMMUNITY): Payer: Self-pay | Admitting: Internal Medicine

## 2021-10-22 ENCOUNTER — Inpatient Hospital Stay (HOSPITAL_COMMUNITY): Payer: PPO

## 2021-10-22 DIAGNOSIS — I442 Atrioventricular block, complete: Secondary | ICD-10-CM | POA: Diagnosis not present

## 2021-10-22 LAB — LIPOPROTEIN A (LPA): Lipoprotein (a): 21.5 nmol/L (ref <75.0)

## 2021-10-22 MED ORDER — ASPIRIN 81 MG PO TBEC: 81.0000 mg | DELAYED_RELEASE_TABLET | Freq: Every day | ORAL | 12 refills | Status: AC

## 2021-10-22 MED ORDER — ACETAMINOPHEN 325 MG PO TABS: 650.0000 mg | ORAL_TABLET | ORAL | Status: DC | PRN

## 2021-10-22 NOTE — Progress Notes (Signed)
Went over discharge paper work with patient, PIV removed. All belongings at bedside. All questions answered.

## 2021-10-22 NOTE — Discharge Summary (Addendum)
ELECTROPHYSIOLOGY PROCEDURE DISCHARGE SUMMARY    Patient ID: Jeff Sheppard,  MRN: [id number], DOB/AGE: 1955-08-06 66 y.o.  Admit date: 10/20/2021 Discharge date: 10/22/2021  Primary Care Physician: Pearline Cables, MD  Primary Cardiologist: None  Electrophysiologist: New to Dr. Ladona Ridgel  Primary Discharge Diagnosis:  Symptomatic bradycardia status post pacemaker implantation this admission  Secondary Discharge Diagnosis:  Adanced AV block  No Known Allergies   Procedures This Admission:  1.  Implantation of a [employer] dual chamber PPM on 10/21/2021 by Dr. Ladona Ridgel. The patient received a Designer, jewellery number  [id number]   PPM with model number  [id number]  BSx right atrial lead and 3830 Med[employer]onic right ventricular lead. There were no immediate post procedure complications. 2.  CXR on 10/22/21 demonstrated no pneumothorax status post device implantation.   Brief HPI: Jeff Sheppard is a 66 y.o. male was admitted for symptomatic bradycardia and electrophysiology team asked to see for consideration of PPM implantation.  Past medical history includes above.  The patient has had symptomatic bradycardia without reversible causes identified.  Risks, benefits, and alternatives to PPM implantation were reviewed with the patient who wished to proceed.   Hospital Course:  The patient was admitted and underwent implantation of a [employer] dual chamber PPM with details as outlined above.  He was monitored on telemetry overnight which demonstrated appropriate pacing.  Left chest was without hematoma or ecchymosis.  The device was interrogated and found to be functioning normally.  CXR was obtained and demonstrated no pneumothorax status post device implantation.  Wound care, arm mobility, and restrictions were reviewed with the patient.  The patient was examined and considered stable for discharge to home.    Pt is not on anticoagulation  Physical Exam: Vitals:    10/21/21 2006 10/21/21 2300 10/22/21 0400 10/22/21 0700  BP: 115/69 (!) 109/92 120/76 123/85  Pulse: 72 68 60 60  Resp: 20 14 12 15   Temp: 98 F (36.7 C) 98 F (36.7 C) 97.9 F (36.6 C) 97.9 F (36.6 C)  TempSrc: Oral Oral Oral Oral  SpO2: 91% 92% 93% 92%  Weight:      Height:        GEN- The patient is well appearing, alert and oriented x 3 today.   HEENT: normocephalic, atraumatic; sclera clear, conjunctiva pink; hearing intact; oropharynx clear; neck supple, no JVP Lymph- no cervical lymphadenopathy Lungs- Clear to ausculation bilaterally, normal work of breathing.  No wheezes, rales, rhonchi Heart- Regular rate and rhythm, no murmurs, rubs or gallops, PMI not laterally displaced GI- soft, non-tender, non-distended, bowel sounds present, no hepatosplenomegaly Extremities- no clubbing, cyanosis, or edema; DP/PT/radial pulses 2+ bilaterally MS- no significant deformity or atrophy Skin- warm and dry, no rash or lesion, left chest without hematoma/ecchymosis Psych- euthymic mood, full affect Neuro- strength and sensation are intact   Labs:   Lab Results  Component Value Date   WBC 7.8 10/20/2021   HGB 14.9 10/20/2021   HCT 42.9 10/20/2021   MCV 85.5 10/20/2021   PLT 209 10/20/2021    Recent Labs  Lab 10/20/21 1845  NA 139  K 3.8  CL 108  CO2 20*  BUN 16  CREATININE 1.05  CALCIUM 9.2  PROT 6.7  BILITOT 0.8  ALKPHOS 50  ALT 14  AST 22  GLUCOSE 89    Discharge Medications:  Allergies as of 10/22/2021   No Known Allergies      Medication List     TAKE  these medications    acetaminophen 325 MG tablet Commonly known as: TYLENOL Take 2 tablets (650 mg total) by mouth every 4 (four) hours as needed for headache or mild pain.   aspirin EC 81 MG tablet Take 1 tablet (81 mg total) by mouth daily. Swallow whole. Start taking on: October 25, 2021 What changed: These instructions start on October 25, 2021. If you are unsure what to do until then, ask your doctor or  other care provider.   ipratropium 0.03 % nasal spray Commonly known as: ATROVENT Place 2 sprays into both nostrils 3 (three) times daily. Use as needed for drainage   Spiriva HandiHaler 18 MCG inhalation capsule Generic drug: tiotropium Place 1 capsule (18 mcg total) into inhaler and inhale daily.   topiramate 25 MG tablet Commonly known as: Topamax Take 1 tablet (25 mg total) by mouth 2 (two) times daily. For migraine prevention   Ubrelvy 50 MG Tabs Generic drug: Ubrogepant Take 50 mg as needed for migraine headache.  May repeat in one hour- max 200mg /24 hours        Disposition:    Follow-up Information     [facility] Follow up.   Why: on 7/13 at 1000 am for post hospital pacemaker check Contact information: [address] [place] [phone]                Duration of Discharge Encounter: Greater than 30 minutes including physician time.  [phone], PA-C  10/22/2021 9:03 AM  EP Attending  Patient seen and examined. Agree with the findings as noted above. The patient is doing well after PPM insertion. There is no pocket hematoma on exam. Interrogation under my direction demonstrates normal DDD PM function. His CXR looks good. He will be discharged home with the usual followup.  10/24/2021 Iyanna Drummer,MD

## 2021-10-22 NOTE — Plan of Care (Signed)
  Problem: Education: Goal: Knowledge of General Education information will improve Description: Including pain rating scale, medication(s)/side effects and non-pharmacologic comfort measures Outcome: Progressing   Problem: Health Behavior/Discharge Planning: Goal: Ability to manage health-related needs will improve Outcome: Progressing   Problem: Clinical Measurements: Goal: Ability to maintain clinical measurements within normal limits will improve Outcome: Progressing Goal: Diagnostic test results will improve Outcome: Progressing Goal: Respiratory complications will improve Outcome: Progressing Goal: Cardiovascular complication will be avoided Outcome: Progressing   Problem: Activity: Goal: Risk for activity intolerance will decrease Outcome: Progressing   Problem: Nutrition: Goal: Adequate nutrition will be maintained Outcome: Progressing   Problem: Coping: Goal: Level of anxiety will decrease Outcome: Progressing   Problem: Elimination: Goal: Will not experience complications related to urinary retention Outcome: Progressing   Problem: Pain Managment: Goal: General experience of comfort will improve Outcome: Progressing   Problem: Safety: Goal: Ability to remain free from injury will improve Outcome: Progressing

## 2021-11-06 ENCOUNTER — Ambulatory Visit (INDEPENDENT_AMBULATORY_CARE_PROVIDER_SITE_OTHER): Payer: PPO

## 2021-11-06 DIAGNOSIS — I442 Atrioventricular block, complete: Secondary | ICD-10-CM | POA: Diagnosis not present

## 2021-11-06 LAB — CUP PACEART INCLINIC DEVICE CHECK
Date Time Interrogation Session: 20230713000000
Implantable Lead Implant Date: 20230627
Implantable Lead Implant Date: 20230627
Implantable Lead Location: 753859
Implantable Lead Location: 753860
Implantable Lead Model: 3830
Implantable Lead Model: 7841
Implantable Lead Serial Number: 1293503
Implantable Pulse Generator Implant Date: 20230627
Lead Channel Impedance Value: 642 Ohm
Lead Channel Impedance Value: 689 Ohm
Lead Channel Pacing Threshold Amplitude: 0.6 V
Lead Channel Pacing Threshold Amplitude: 0.8 V
Lead Channel Pacing Threshold Pulse Width: 0.4 ms
Lead Channel Pacing Threshold Pulse Width: 0.4 ms
Lead Channel Sensing Intrinsic Amplitude: 25 mV
Lead Channel Sensing Intrinsic Amplitude: 7.9 mV
Lead Channel Setting Pacing Amplitude: 3.5 V
Lead Channel Setting Pacing Amplitude: 3.5 V
Lead Channel Setting Pacing Pulse Width: 0.4 ms
Lead Channel Setting Sensing Sensitivity: 2.5 mV
Pulse Gen Serial Number: 585966

## 2021-11-06 NOTE — Patient Instructions (Signed)

## 2021-11-06 NOTE — Progress Notes (Signed)
Wound check appointment. Steri-strips removed. Wound without redness or edema. Incision edges approximated, wound well healed. Normal device function. Thresholds, sensing, and impedances consistent with implant measurements. Device programmed at 3.5V/auto capture programmed on for extra safety margin until 3 month visit. Histogram distribution appropriate for patient and level of activity. No mode switches or high ventricular rates noted. PMT events noted, appears false. Patient educated about wound care, arm mobility, lifting restrictions. ROV in 3 months with implanting physician.

## 2021-11-10 ENCOUNTER — Other Ambulatory Visit: Payer: Self-pay | Admitting: Family Medicine

## 2021-11-10 DIAGNOSIS — G43109 Migraine with aura, not intractable, without status migrainosus: Secondary | ICD-10-CM

## 2021-11-26 ENCOUNTER — Ambulatory Visit: Payer: HMO | Admitting: Cardiology

## 2021-12-08 ENCOUNTER — Ambulatory Visit (INDEPENDENT_AMBULATORY_CARE_PROVIDER_SITE_OTHER): Payer: PPO

## 2021-12-08 VITALS — Ht 67.0 in | Wt 154.0 lb

## 2021-12-08 DIAGNOSIS — Z0000 Encounter for general adult medical examination without abnormal findings: Secondary | ICD-10-CM | POA: Diagnosis not present

## 2021-12-08 NOTE — Progress Notes (Signed)
Subjective:   Jeff Sheppard is a 66 y.o. male who presents for an Initial Medicare Annual Wellness Visit.  I connected with Jeff Sheppard today by telephone and verified that I am speaking with the correct person using two identifiers. Location patient: home Location provider: work Persons participating in the virtual visit: patient, Engineer, civil (consulting).    I discussed the limitations, risks, security and privacy concerns of performing an evaluation and management service by telephone and the availability of in person appointments. I also discussed with the patient that there may be a patient responsible charge related to this service. The patient expressed understanding and verbally consented to this telephonic visit.    Interactive audio and video telecommunications were attempted between this provider and patient, however failed, due to patient having technical difficulties OR patient did not have access to video capability.  We continued and completed visit with audio only.  Some vital signs may be absent or patient reported.   Time Spent with patient on telephone encounter: 20 minutesle   Review of Systems     Cardiac Risk Factors include: advanced age (>85men, >77 women);male gender     Objective:    Today's Vitals   12/08/21 1110  Weight: 154 lb (69.9 kg)  Height: 5\' 7"  (1.702 m)   Body mass index is 24.12 kg/m.     12/08/2021   11:15 AM 10/20/2021    8:00 PM  Advanced Directives  Does Patient Have a Medical Advance Directive? No No  Would patient like information on creating a medical advance directive?  No - Patient declined    Current Medications (verified) Outpatient Encounter Medications as of 12/08/2021  Medication Sig   acetaminophen (TYLENOL) 325 MG tablet Take 2 tablets (650 mg total) by mouth every 4 (four) hours as needed for headache or mild pain.   aspirin EC 81 MG tablet Take 1 tablet (81 mg total) by mouth daily. Swallow whole.   tiotropium (SPIRIVA HANDIHALER) 18  MCG inhalation capsule Place 1 capsule (18 mcg total) into inhaler and inhale daily.   topiramate (TOPAMAX) 25 MG tablet TAKE 1 TABLET BY MOUTH TWICE DAILY. FOR MIGRAINE PREVENTION   ipratropium (ATROVENT) 0.03 % nasal spray Place 2 sprays into both nostrils 3 (three) times daily. Use as needed for drainage (Patient not taking: Reported on 12/08/2021)   Ubrogepant (UBRELVY) 50 MG TABS Take 50 mg as needed for migraine headache.  May repeat in one hour- max 200mg /24 hours (Patient not taking: Reported on 12/08/2021)   No facility-administered encounter medications on file as of 12/08/2021.    Allergies (verified) Patient has no known allergies.   History: Past Medical History:  Diagnosis Date   Chicken pox    Migraines    Past Surgical History:  Procedure Laterality Date   APPENDECTOMY     30 years ago   CATARACT EXTRACTION  2018   INNER EAR SURGERY     pt states he could not here   PACEMAKER IMPLANT N/A 10/21/2021   Procedure: PACEMAKER IMPLANT;  Surgeon: 2019, MD;  Location: [facility];  Service: Cardiovascular;  Laterality: N/A;   Family History  Problem Relation Age of Onset   Cancer Mother    Breast cancer Mother    Early death Mother    Social History   Socioeconomic History   Marital status: Married    Spouse name: Not on file   Number of children: 3   Years of education: 8   Highest education  level: Not on file  Occupational History   Not on file  Tobacco Use   Smoking status: Every Day    Packs/day: 1.00    Years: 60.00    Total pack years: 60.00    Types: Cigarettes   Smokeless tobacco: Never  Vaping Use   Vaping Use: Never used  Substance and Sexual Activity   Alcohol use: Never   Drug use: Never   Sexual activity: Not Currently  Other Topics Concern   Not on file  Social History Narrative   Not on file   Social Determinants of Health   Financial Resource Strain: Low Risk  (12/08/2021)   Overall Financial Resource Strain (CARDIA)     Difficulty of Paying Living Expenses: Not hard at all  Food Insecurity: No Food Insecurity (12/08/2021)   Hunger Vital Sign    Worried About Running Out of Food in the Last Year: Never true    Ran Out of Food in the Last Year: Never true  Transportation Needs: No Transportation Needs (12/08/2021)   PRAPARE - Administrator, Civil Service (Medical): No    Lack of Transportation (Non-Medical): No  Physical Activity: Inactive (12/08/2021)   Exercise Vital Sign    Days of Exercise per Week: 0 days    Minutes of Exercise per Session: 0 min  Stress: No Stress Concern Present (12/08/2021)   [employer] of Occupational Health - Occupational Stress Questionnaire    Feeling of Stress : Not at all  Social Connections: Moderately Isolated (12/08/2021)   Social Connection and Isolation Panel [NHANES]    Frequency of Communication with Friends and Family: More than three times a week    Frequency of Social Gatherings with Friends and Family: More than three times a week    Attends Religious Services: Never    Database administrator or Organizations: No    Attends Engineer, structural: Never    Marital Status: Married    Tobacco Counseling Ready to quit: Not Answered Counseling given: Not Answered   Clinical Intake:  Pre-visit preparation completed: Yes  Pain : No/denies pain     BMI - recorded: 24.12 Nutritional Status: BMI of 19-24  Normal Nutritional Risks: None Diabetes: No  How often do you need to have someone help you when you read instructions, pamphlets, or other written materials from your doctor or pharmacy?: 1 - Never  Diabetic?No  Interpreter Needed?: No  Information entered by :: Thomasenia Sales LPN   Activities of Daily Living    12/08/2021   11:19 AM 10/20/2021    4:47 PM  In your present state of health, do you have any difficulty performing the following activities:  Hearing? 1 0  Comment right ear   Vision? 0 0  Difficulty  concentrating or making decisions? 0 0  Walking or climbing stairs? 0 0  Dressing or bathing? 0 0  Doing errands, shopping? 0 0  Preparing Food and eating ? N   Using the Toilet? N   In the past six months, have you accidently leaked urine? N   Do you have problems with loss of bowel control? N   Managing your Medications? N   Managing your Finances? N   Housekeeping or managing your Housekeeping? N     Patient Care Team: Copland, Gwenlyn Found, MD as PCP - General (Family Medicine)  Indicate any recent Medical Services you may have received from other than [facility] providers in the past year (date may be  approximate).     Assessment:   This is a routine wellness examination for [place].  Hearing/Vision screen Hearing Screening - Comments:: Pt c/o hearing loss in right ear Vision Screening - Comments:: Last eye exam-5-6 years ago  Dietary issues and exercise activities discussed: Current Exercise Habits: The patient does not participate in regular exercise at present, Exercise limited by: None identified   Goals Addressed             This Visit's Progress    Patient Stated       Drink more water        Depression Screen    12/08/2021   11:19 AM 07/14/2021   10:20 AM  PHQ 2/9 Scores  PHQ - 2 Score 0 0    Fall Risk    12/08/2021   11:17 AM 07/14/2021   10:20 AM  Fall Risk   Falls in the past year? 0 0  Number falls in past yr: 0 0  Injury with Fall? 0 0  Follow up Falls prevention discussed     FALL RISK PREVENTION PERTAINING TO THE HOME:  Any stairs in or around the home? Yes  If so, are there any without handrails? No  Home free of loose throw rugs in walkways, pet beds, electrical cords, etc? Yes  Adequate lighting in your home to reduce risk of falls? Yes   ASSISTIVE DEVICES UTILIZED TO PREVENT FALLS:  Life alert? No  Use of a cane, walker or w/c? No  Grab bars in the bathroom? Yes  Shower chair or bench in shower? Yes  Elevated toilet seat or a  handicapped toilet? Yes   TIMED UP AND GO:  Was the test performed? No . Phone visit   Cognitive Function:Normal cognitive status assessed by this Nurse Health Advisor. No abnormalities found.          Immunizations Immunization History  Administered Date(s) Administered   PFIZER Comirnaty(Gray Top)Covid-19 Tri-Sucrose Vaccine 07/19/2019, 08/09/2019, 03/31/2020   PNEUMOCOCCAL CONJUGATE-20 07/14/2021   Tdap 05/26/2018    TDAP status: Up to date  Flu Vaccine status: Declined, Education has been provided regarding the importance of this vaccine but patient still declined. Advised may receive this vaccine at local pharmacy or Health Dept. Aware to provide a copy of the vaccination record if obtained from local pharmacy or Health Dept. Verbalized acceptance and understanding.  Pneumococcal vaccine status: Up to date  Covid-19 vaccine status: Information provided on how to obtain vaccines.   Qualifies for Shingles Vaccine? Yes   Zostavax completed No   Shingrix Completed?: No.    Education has been provided regarding the importance of this vaccine. Patient has been advised to call insurance company to determine out of pocket expense if they have not yet received this vaccine. Advised may also receive vaccine at local pharmacy or Health Dept. Verbalized acceptance and understanding.  Screening Tests Health Maintenance  Topic Date Due   COLONOSCOPY (Pts 45-34yrs Insurance coverage will need to be confirmed)  Never done   Zoster Vaccines- Shingrix (1 of 2) Never done   COVID-19 Vaccine (4 - Pfizer series) 05/26/2020   INFLUENZA VACCINE  11/25/2021   TETANUS/TDAP  05/26/2028   Pneumonia Vaccine 74+ Years old  Completed   Hepatitis C Screening  Completed   HPV VACCINES  Aged Out    Health Maintenance  Health Maintenance Due  Topic Date Due   COLONOSCOPY (Pts 45-37yrs Insurance coverage will need to be confirmed)  Never done   Zoster Vaccines-  Shingrix (1 of 2) Never done    COVID-19 Vaccine (4 - Pfizer series) 05/26/2020   INFLUENZA VACCINE  11/25/2021    Colorectal cancer screening: Due-Declined  Lung Cancer Screening: Completed 07/2021  Additional Screening:  Hepatitis C Screening: Completed 06/16/2021  Vision Screening: Recommended annual ophthalmology exams for early detection of glaucoma and other disorders of the eye. Is the patient up to date with their annual eye exam?  No  Who is the provider or what is the name of the office in which the patient attends annual eye exams? unknown   Dental Screening: Recommended annual dental exams for proper oral hygiene  Community Resource Referral / Chronic Care Management: CRR required this visit?  No   CCM required this visit?  No      Plan:     I have personally reviewed and noted the following in the patient's chart:   Medical and social history Use of alcohol, tobacco or illicit drugs  Current medications and supplements including opioid prescriptions. Patient is not currently taking opioid prescriptions. Functional ability and status Nutritional status Physical activity Advanced directives List of other physicians Hospitalizations, surgeries, and ER visits in previous 12 months Vitals Screenings to include cognitive, depression, and falls Referrals and appointments  In addition, I have reviewed and discussed with patient certain preventive protocols, quality metrics, and best practice recommendations. A written personalized care plan for preventive services as well as general preventive health recommendations were provided to patient.   Due to this being a telephonic visit, the after visit summary with patients personalized plan was offered to patient via mail or my-chart. Patient would like to access on my-chart.   Roanna Raider, LPN   10/26/6376  Nurse Health Advisor  Nurse Notes: None

## 2021-12-08 NOTE — Patient Instructions (Signed)
[name] , Thank you for taking time to complete your Medicare Wellness Visit. I appreciate your ongoing commitment to your health goals. Please review the following plan we discussed and let me know if I can assist you in the future.   Screening recommendations/referrals: Colonoscopy: Due-Declined Recommended yearly ophthalmology/optometry visit for glaucoma screening and checkup Recommended yearly dental visit for hygiene and checkup  Vaccinations: Influenza vaccine: Due 12/2021 Pneumococcal vaccine: Up to date Tdap vaccine: Up to date Shingles vaccine: Due-May obtain vaccine at your local pharmacy. Covid-19: Booster available at your local pharmacy.  Advanced directives: May pick up information at your next visit  Conditions/risks identified: See problem list  Next appointment: Follow up in one year for your annual wellness visit.   Preventive Care 26 Years and Older, Male Preventive care refers to lifestyle choices and visits with your health care provider that can promote health and wellness. What does preventive care include? A yearly physical exam. This is also called an annual well check. Dental exams once or twice a year. Routine eye exams. Ask your health care provider how often you should have your eyes checked. Personal lifestyle choices, including: Daily care of your teeth and gums. Regular physical activity. Eating a healthy diet. Avoiding tobacco and drug use. Limiting alcohol use. Practicing safe sex. Taking low doses of aspirin every day. Taking vitamin and mineral supplements as recommended by your health care provider. What happens during an annual well check? The services and screenings done by your health care provider during your annual well check will depend on your age, overall health, lifestyle risk factors, and family history of disease. Counseling  Your health care provider may ask you questions about your: Alcohol use. Tobacco use. Drug  use. Emotional well-being. Home and relationship well-being. Sexual activity. Eating habits. History of falls. Memory and ability to understand (cognition). Work and work Astronomer. Screening  You may have the following tests or measurements: Height, weight, and BMI. Blood pressure. Lipid and cholesterol levels. These may be checked every 5 years, or more frequently if you are over 81 years old. Skin check. Lung cancer screening. You may have this screening every year starting at age 37 if you have a 30-pack-year history of smoking and currently smoke or have quit within the past 15 years. Fecal occult blood test (FOBT) of the stool. You may have this test every year starting at age 75. Flexible sigmoidoscopy or colonoscopy. You may have a sigmoidoscopy every 5 years or a colonoscopy every 10 years starting at age 48. Prostate cancer screening. Recommendations will vary depending on your family history and other risks. Hepatitis C blood test. Hepatitis B blood test. Sexually transmitted disease (STD) testing. Diabetes screening. This is done by checking your blood sugar (glucose) after you have not eaten for a while (fasting). You may have this done every 1-3 years. Abdominal aortic aneurysm (AAA) screening. You may need this if you are a current or former smoker. Osteoporosis. You may be screened starting at age 14 if you are at high risk. Talk with your health care provider about your test results, treatment options, and if necessary, the need for more tests. Vaccines  Your health care provider may recommend certain vaccines, such as: Influenza vaccine. This is recommended every year. Tetanus, diphtheria, and acellular pertussis (Tdap, Td) vaccine. You may need a Td booster every 10 years. Zoster vaccine. You may need this after age 46. Pneumococcal 13-valent conjugate (PCV13) vaccine. One dose is recommended after age 80. Pneumococcal  polysaccharide (PPSV23) vaccine. One dose is  recommended after age 50. Talk to your health care provider about which screenings and vaccines you need and how often you need them. This information is not intended to replace advice given to you by your health care provider. Make sure you discuss any questions you have with your health care provider. Document Released: 05/10/2015 Document Revised: 01/01/2016 Document Reviewed: 02/12/2015 Elsevier Interactive Patient Education  2017 [place] Prevention in the Home Falls can cause injuries. They can happen to people of all ages. There are many things you can do to make your home safe and to help prevent falls. What can I do on the outside of my home? Regularly fix the edges of walkways and driveways and fix any cracks. Remove anything that might make you trip as you walk through a door, such as a raised step or threshold. Trim any bushes or trees on the path to your home. Use bright outdoor lighting. Clear any walking paths of anything that might make someone trip, such as rocks or tools. Regularly check to see if handrails are loose or broken. Make sure that both sides of any steps have handrails. Any raised decks and porches should have guardrails on the edges. Have any leaves, snow, or ice cleared regularly. Use sand or salt on walking paths during winter. Clean up any spills in your garage right away. This includes oil or grease spills. What can I do in the bathroom? Use night lights. Install grab bars by the toilet and in the tub and shower. Do not use towel bars as grab bars. Use non-skid mats or decals in the tub or shower. If you need to sit down in the shower, use a plastic, non-slip stool. Keep the floor dry. Clean up any water that spills on the floor as soon as it happens. Remove soap buildup in the tub or shower regularly. Attach bath mats securely with double-sided non-slip rug tape. Do not have throw rugs and other things on the floor that can make you  trip. What can I do in the bedroom? Use night lights. Make sure that you have a light by your bed that is easy to reach. Do not use any sheets or blankets that are too big for your bed. They should not hang down onto the floor. Have a firm chair that has side arms. You can use this for support while you get dressed. Do not have throw rugs and other things on the floor that can make you trip. What can I do in the kitchen? Clean up any spills right away. Avoid walking on wet floors. Keep items that you use a lot in easy-to-reach places. If you need to reach something above you, use a strong step stool that has a grab bar. Keep electrical cords out of the way. Do not use floor polish or wax that makes floors slippery. If you must use wax, use non-skid floor wax. Do not have throw rugs and other things on the floor that can make you trip. What can I do with my stairs? Do not leave any items on the stairs. Make sure that there are handrails on both sides of the stairs and use them. Fix handrails that are broken or loose. Make sure that handrails are as long as the stairways. Check any carpeting to make sure that it is firmly attached to the stairs. Fix any carpet that is loose or worn. Avoid having throw rugs at the top  or bottom of the stairs. If you do have throw rugs, attach them to the floor with carpet tape. Make sure that you have a light switch at the top of the stairs and the bottom of the stairs. If you do not have them, ask someone to add them for you. What else can I do to help prevent falls? Wear shoes that: Do not have high heels. Have rubber bottoms. Are comfortable and fit you well. Are closed at the toe. Do not wear sandals. If you use a stepladder: Make sure that it is fully opened. Do not climb a closed stepladder. Make sure that both sides of the stepladder are locked into place. Ask someone to hold it for you, if possible. Clearly mark and make sure that you can  see: Any grab bars or handrails. First and last steps. Where the edge of each step is. Use tools that help you move around (mobility aids) if they are needed. These include: Canes. Walkers. Scooters. Crutches. Turn on the lights when you go into a dark area. Replace any light bulbs as soon as they burn out. Set up your furniture so you have a clear path. Avoid moving your furniture around. If any of your floors are uneven, fix them. If there are any pets around you, be aware of where they are. Review your medicines with your doctor. Some medicines can make you feel dizzy. This can increase your chance of falling. Ask your doctor what other things that you can do to help prevent falls. This information is not intended to replace advice given to you by your health care provider. Make sure you discuss any questions you have with your health care provider. Document Released: 02/07/2009 Document Revised: 09/19/2015 Document Reviewed: 05/18/2014 Elsevier Interactive Patient Education  2017 [employer].

## 2021-12-12 ENCOUNTER — Telehealth: Payer: Self-pay | Admitting: Family Medicine

## 2021-12-12 ENCOUNTER — Other Ambulatory Visit: Payer: Self-pay | Admitting: Family Medicine

## 2021-12-12 DIAGNOSIS — G43109 Migraine with aura, not intractable, without status migrainosus: Secondary | ICD-10-CM

## 2021-12-12 MED ORDER — TOPIRAMATE 25 MG PO TABS: ORAL_TABLET | ORAL | 0 refills | Status: DC

## 2021-12-12 NOTE — Telephone Encounter (Signed)
Medication: topiramate (TOPAMAX) 25 MG tablet [[id number]]  Has the patient contacted their pharmacy? Yes.   Advised to call us to help expedite the refill since patient Is low.   Preferred Pharmacy (with phone number or street name): [facility] [address], [place] - [address]  [address] [place] [address]  Phone:  [phone]  Fax:  [phone]   Agent: Please be advised that RX refills may take up to 3 business days. We ask that you follow-up with your pharmacy.

## 2021-12-12 NOTE — Telephone Encounter (Signed)
Rx sent 

## 2022-01-01 DIAGNOSIS — B019 Varicella without complication: Secondary | ICD-10-CM | POA: Insufficient documentation

## 2022-01-01 DIAGNOSIS — G43909 Migraine, unspecified, not intractable, without status migrainosus: Secondary | ICD-10-CM | POA: Insufficient documentation

## 2022-01-05 ENCOUNTER — Ambulatory Visit: Payer: PPO | Admitting: Cardiology

## 2022-01-21 ENCOUNTER — Ambulatory Visit (INDEPENDENT_AMBULATORY_CARE_PROVIDER_SITE_OTHER): Payer: PPO

## 2022-01-21 DIAGNOSIS — I442 Atrioventricular block, complete: Secondary | ICD-10-CM | POA: Diagnosis not present

## 2022-01-27 LAB — CUP PACEART REMOTE DEVICE CHECK
Date Time Interrogation Session: 20231003075854
Implantable Lead Implant Date: 20230627
Implantable Lead Implant Date: 20230627
Implantable Lead Location: 753859
Implantable Lead Location: 753860
Implantable Lead Model: 3830
Implantable Lead Model: 7841
Implantable Lead Serial Number: 1293503
Implantable Pulse Generator Implant Date: 20230627
Pulse Gen Serial Number: 585966

## 2022-01-29 NOTE — Progress Notes (Signed)
Remote pacemaker transmission.   

## 2022-01-30 ENCOUNTER — Encounter: Payer: Self-pay | Admitting: Internal Medicine

## 2022-01-30 ENCOUNTER — Ambulatory Visit: Payer: PPO | Attending: Internal Medicine | Admitting: Internal Medicine

## 2022-01-30 VITALS — BP 124/84 | HR 80 | Ht 66.0 in | Wt 158.0 lb

## 2022-01-30 DIAGNOSIS — J42 Unspecified chronic bronchitis: Secondary | ICD-10-CM | POA: Diagnosis not present

## 2022-01-30 DIAGNOSIS — I442 Atrioventricular block, complete: Secondary | ICD-10-CM

## 2022-01-30 LAB — CUP PACEART INCLINIC DEVICE CHECK
Date Time Interrogation Session: 20231006141925
Implantable Lead Implant Date: 20230627
Implantable Lead Implant Date: 20230627
Implantable Lead Location: 753859
Implantable Lead Location: 753860
Implantable Lead Model: 3830
Implantable Lead Model: 7841
Implantable Lead Serial Number: 1293503
Implantable Pulse Generator Implant Date: 20230627
Lead Channel Impedance Value: 610 Ohm
Lead Channel Impedance Value: 705 Ohm
Lead Channel Pacing Threshold Amplitude: 0.8 V
Lead Channel Pacing Threshold Amplitude: 0.9 V
Lead Channel Pacing Threshold Pulse Width: 0.4 ms
Lead Channel Pacing Threshold Pulse Width: 0.4 ms
Lead Channel Sensing Intrinsic Amplitude: 5.1 mV
Lead Channel Setting Pacing Amplitude: 2 V
Lead Channel Setting Pacing Amplitude: 2.5 V
Lead Channel Setting Pacing Pulse Width: 0.4 ms
Lead Channel Setting Sensing Sensitivity: 2.5 mV
Pulse Gen Serial Number: 585966

## 2022-01-30 NOTE — Patient Instructions (Signed)
Medication Instructions:  Your physician recommends that you continue on your current medications as directed. Please refer to the Current Medication list given to you today.  *If you need a refill on your cardiac medications before your next appointment, please call your pharmacy*  Lab Work: None ordered.  If you have labs (blood work) drawn today and your tests are completely normal, you will receive your results only by: MyChart Message (if you have MyChart) OR A paper copy in the mail If you have any lab test that is abnormal or we need to change your treatment, we will call you to review the results.  Testing/Procedures: None ordered.  Follow-Up: At [facility], you and your health needs are our priority.  As part of our continuing mission to provide you with exceptional heart care, we have created designated Provider Care Teams.  These Care Teams include your primary Cardiologist (physician) and Advanced Practice Providers (APPs -  Physician Assistants and Nurse Practitioners) who all work together to provide you with the care you need, when you need it.  We recommend signing up for the patient portal called "MyChart".  Sign up information is provided on this After Visit Summary.  MyChart is used to connect with patients for Virtual Visits (Telemedicine).  Patients are able to view lab/test results, encounter notes, upcoming appointments, etc.  Non-urgent messages can be sent to your provider as well.   To learn more about what you can do with MyChart, go to [email or web address].    Your next appointment:   1 year(s)  The format for your next appointment:   In Person  Provider:   Gregg Taylor, MD{or one of the following Advanced Practice Providers on your designated Care Team:   Renee Ursuy, PA-C Michael "Andy" Tillery, PA-C  Remote monitoring is used to monitor your Pacemaker from home. This monitoring reduces the number of office visits required to check your device to  one time per year. It allows us to keep an eye on the functioning of your device to ensure it is working properly. You are scheduled for a device check from home on 04/22/22. You may send your transmission at any time that day. If you have a wireless device, the transmission will be sent automatically. After your physician reviews your transmission, you will receive a postcard with your next transmission date.  Important Information About Sugar     ' 

## 2022-01-30 NOTE — Progress Notes (Signed)
HPI [name] returns today for followup. He is a pleasant 66 yo man with tobacco abuse and CHB s/p PPM insertion. He feels well. He is still smoking 1/2 ppd. He denies chest pain or sob. No edema.  No Known Allergies   Current Outpatient Medications  Medication Sig Dispense Refill   aspirin EC 81 MG tablet Take 1 tablet (81 mg total) by mouth daily. Swallow whole. 30 tablet 12   topiramate (TOPAMAX) 25 MG tablet TAKE 1 TABLET BY MOUTH TWICE DAILY FOR MIGRAINE PREVENTION 180 tablet 0   Ubrogepant (UBRELVY) 50 MG TABS Take 50 mg as needed for migraine headache.  May repeat in one hour- max 200mg /24 hours 30 tablet 3   No current facility-administered medications for this visit.     Past Medical History:  Diagnosis Date   Chicken pox    Migraines     ROS:   All systems reviewed and negative except as noted in the HPI.   Past Surgical History:  Procedure Laterality Date   APPENDECTOMY     30 years ago   CATARACT EXTRACTION  2018   INNER EAR SURGERY     pt states he could not here   PACEMAKER IMPLANT N/A 10/21/2021   Procedure: PACEMAKER IMPLANT;  Surgeon: Jeff Lance, MD;  Location: [facility];  Service: Cardiovascular;  Laterality: N/A;     Family History  Problem Relation Age of Onset   Cancer Mother    Breast cancer Mother    Early death Mother      Social History   Socioeconomic History   Marital status: Married    Spouse name: Not on file   Number of children: 3   Years of education: 8   Highest education level: Not on file  Occupational History   Not on file  Tobacco Use   Smoking status: Every Day    Packs/day: 1.00    Years: 60.00    Total pack years: 60.00    Types: Cigarettes   Smokeless tobacco: Never  Vaping Use   Vaping Use: Never used  Substance and Sexual Activity   Alcohol use: Never   Drug use: Never   Sexual activity: Not Currently  Other Topics Concern   Not on file  Social History Narrative   Not on file    Social Determinants of Health   Financial Resource Strain: Low Risk  (12/08/2021)   Overall Financial Resource Strain (CARDIA)    Difficulty of Paying Living Expenses: Not hard at all  Food Insecurity: No Food Insecurity (12/08/2021)   Hunger Vital Sign    Worried About Running Out of Food in the Last Year: Never true    [place] in the Last Year: Never true  Transportation Needs: No Transportation Needs (12/08/2021)   PRAPARE - Hydrologist (Medical): No    Lack of Transportation (Non-Medical): No  Physical Activity: Inactive (12/08/2021)   Exercise Vital Sign    Days of Exercise per Week: 0 days    Minutes of Exercise per Session: 0 min  Stress: No Stress Concern Present (12/08/2021)   Lee    Feeling of Stress : Not at all  Social Connections: Moderately Isolated (12/08/2021)   Social Connection and Isolation Panel [NHANES]    Frequency of Communication with Friends and Family: More than three times a week    Frequency of Social  Gatherings with Friends and Family: More than three times a week    Attends Religious Services: Never    Database administrator or Organizations: No    Attends Banker Meetings: Never    Marital Status: Married  Catering manager Violence: Not At Risk (12/08/2021)   Humiliation, Afraid, Rape, and Kick questionnaire    Fear of Current or Ex-Partner: No    Emotionally Abused: No    Physically Abused: No    Sexually Abused: No     BP 124/84   Pulse 80   Ht 5\' 6"  (1.676 m)   Wt 158 lb (71.7 kg)   SpO2 95%   BMI 25.50 kg/m   Physical Exam:  Well appearing NAD HEENT: Unremarkable Neck:  No JVD, no thyromegally Lymphatics:  No adenopathy Back:  No CVA tenderness Lungs:  Clear with no wheezes HEART:  Regular rate rhythm, no murmurs, no rubs, no clicks Abd:  soft, positive bowel sounds, no organomegally, no rebound, no  guarding Ext:  2 plus pulses, no edema, no cyanosis, no clubbing Skin:  No rashes no nodules Neuro:  CN II through XII intact, motor grossly intact  EKG - nsr with ventricular pacing  DEVICE  Normal device function.  See PaceArt for details.   Assess/Plan:  CHB - he is s/p PPM insertion. His outputs were turned down. PPM -his device is working normally. We will check in a few months. HTN - his bp is well controlled. Tobacco abuse - I encouraged him to lose weight.   [employer] Jeff Overturf,MD

## 2022-02-04 ENCOUNTER — Other Ambulatory Visit (HOSPITAL_BASED_OUTPATIENT_CLINIC_OR_DEPARTMENT_OTHER): Payer: Self-pay

## 2022-02-04 MED ORDER — COVID-19 MRNA 2023-2024 VACCINE (COMIRNATY) 0.3 ML INJECTION
INTRAMUSCULAR | 0 refills | Status: DC
  Filled 2022-02-04: qty 0.3, 1d supply, fill #0

## 2022-03-02 ENCOUNTER — Ambulatory Visit (HOSPITAL_BASED_OUTPATIENT_CLINIC_OR_DEPARTMENT_OTHER)
Admission: RE | Admit: 2022-03-02 | Discharge: 2022-03-02 | Disposition: A | Discharge status: Home / Self Care | Payer: PPO | Source: Ambulatory Visit | Attending: Family Medicine | Admitting: Family Medicine

## 2022-03-02 DIAGNOSIS — R918 Other nonspecific abnormal finding of lung field: Secondary | ICD-10-CM | POA: Diagnosis not present

## 2022-03-02 DIAGNOSIS — R911 Solitary pulmonary nodule: Secondary | ICD-10-CM | POA: Diagnosis not present

## 2022-03-02 DIAGNOSIS — J432 Centrilobular emphysema: Secondary | ICD-10-CM | POA: Diagnosis not present

## 2022-03-05 ENCOUNTER — Encounter: Payer: Self-pay | Admitting: Family Medicine

## 2022-03-05 ENCOUNTER — Other Ambulatory Visit: Payer: Self-pay | Admitting: Family Medicine

## 2022-03-05 DIAGNOSIS — R918 Other nonspecific abnormal finding of lung field: Secondary | ICD-10-CM

## 2022-03-05 NOTE — Progress Notes (Signed)
Called and LMOM- CT stable, will repeat chest CT in 6 months. I will order CT for him   IMPRESSION: 1. Lung-RADS 3, probably benign findings. Short-term follow-up in 6 months is recommended with repeat low-dose chest CT without contrast (please use the following order, CT CHEST LCS NODULE FOLLOW-UP W/O CM). 2. Numerous new bilateral solid pulmonary nodules are seen, largest measures 4.6 mm. Previously seen bilateral solid pulmonary nodules have resolved. 3. Aortic Atherosclerosis (ICD10-I70.0) and Emphysema (ICD10-J43.9).

## 2022-04-29 ENCOUNTER — Ambulatory Visit (INDEPENDENT_AMBULATORY_CARE_PROVIDER_SITE_OTHER): Payer: PPO

## 2022-04-29 DIAGNOSIS — I442 Atrioventricular block, complete: Secondary | ICD-10-CM

## 2022-04-29 DIAGNOSIS — Z950 Presence of cardiac pacemaker: Secondary | ICD-10-CM

## 2022-04-30 LAB — CUP PACEART REMOTE DEVICE CHECK
Battery Remaining Longevity: 168 mo
Battery Remaining Percentage: 100 %
Brady Statistic RA Percent Paced: 32 %
Brady Statistic RV Percent Paced: 100 %
Date Time Interrogation Session: 20240103033100
Implantable Lead Connection Status: 753985
Implantable Lead Connection Status: 753985
Implantable Lead Implant Date: 20230627
Implantable Lead Implant Date: 20230627
Implantable Lead Location: 753859
Implantable Lead Location: 753860
Implantable Lead Model: 3830
Implantable Lead Model: 7841
Implantable Lead Serial Number: 1293503
Implantable Pulse Generator Implant Date: 20230627
Lead Channel Impedance Value: 544 Ohm
Lead Channel Impedance Value: 674 Ohm
Lead Channel Pacing Threshold Amplitude: 0.6 V
Lead Channel Pacing Threshold Amplitude: 1.2 V
Lead Channel Pacing Threshold Pulse Width: 0.4 ms
Lead Channel Pacing Threshold Pulse Width: 0.4 ms
Lead Channel Setting Pacing Amplitude: 2 V
Lead Channel Setting Pacing Amplitude: 2.5 V
Lead Channel Setting Pacing Pulse Width: 0.4 ms
Lead Channel Setting Sensing Sensitivity: 2.5 mV
Pulse Gen Serial Number: 585966
Zone Setting Status: 755011

## 2022-05-25 NOTE — Progress Notes (Signed)
Remote pacemaker transmission.   

## 2022-06-09 ENCOUNTER — Other Ambulatory Visit: Payer: Self-pay | Admitting: Family Medicine

## 2022-06-09 DIAGNOSIS — G43109 Migraine with aura, not intractable, without status migrainosus: Secondary | ICD-10-CM

## 2022-07-07 ENCOUNTER — Other Ambulatory Visit: Payer: Self-pay

## 2022-07-09 NOTE — Progress Notes (Deleted)
[facility] [address] [place], [place] [address] [phone]  Date:  07/15/2022   Name:  Jeff Sheppard   DOB:  [birth year]   MRN:  [id number]  PCP:  Darreld Mclean, MD    Chief Complaint: No chief complaint on file.   History of Present Illness:  Jeff Sheppard is a 67 y.o. very pleasant male patient who presents with the following:  Patient seen today for follow-up visit Most recently seen by myself in June of last year-history of long-term severe tobacco use, prediabetes, COPD, migraine headache  He also had a pacemaker placed last summer for symptomatic bradycardia with advanced AV block Most recent visit with his cardiologist, Dr. Lovena Le in [place] working normally, blood pressure well-controlled  We have been following some lung nodules, most recent lung CT in November.  8-monthfollow-up was recommended and follow-up CT is ordered Can offer CT coronary calcium  Colon cancer screening Shingrix  Can update blood work today Lab Results  Component Value Date   HGBA1C 6.1 07/14/2021    Patient Active Problem List   Diagnosis Date Noted   Migraines 01/01/2022   Chicken pox 01/01/2022   Complete heart block ([facility] [place] 10/20/2021   Prediabetes 07/14/2021   Tobacco abuse 07/14/2021   COPD (chronic obstructive pulmonary disease) ([facility] 07/14/2021    Past Medical History:  Diagnosis Date   Chicken pox    Migraines     Past Surgical History:  Procedure Laterality Date   APPENDECTOMY     30 years ago   CATARACT EXTRACTION  2018   INNER EAR SURGERY     pt states he could not here   PACEMAKER IMPLANT N/A 10/21/2021   Procedure: PACEMAKER IMPLANT;  Surgeon: TEvans Lance MD;  [facility];  Service: Cardiovascular;  Laterality: N/A;    Social History   Tobacco Use   Smoking status: Every Day    Packs/day: 1.00    Years: 60.00    Additional pack years: 0.00     Total pack years: 60.00    Types: Cigarettes   Smokeless tobacco: Never  Vaping Use   Vaping Use: Never used  Substance Use Topics   Alcohol use: Never   Drug use: Never    Family History  Problem Relation Age of Onset   Cancer Mother    Breast cancer Mother    Early death Mother     No Known Allergies  Medication list has been reviewed and updated.  Current Outpatient Medications on File Prior to Visit  Medication Sig Dispense Refill   aspirin EC 81 MG tablet Take 1 tablet (81 mg total) by mouth daily. Swallow whole. 30 tablet 12   COVID-19 mRNA vaccine 2023-2024 (COMIRNATY) SUSP injection Inject into the muscle. 0.3 mL 0   topiramate (TOPAMAX) 25 MG tablet TAKE 1 TABLET BY MOUTH TWICE DAILY FOR  MIGRAINE  PREVENTION 180 tablet 0   Ubrogepant (UBRELVY) 50 MG TABS Take 50 mg as needed for migraine headache.  May repeat in one hour- max '200mg'$ /24 hours 30 tablet 3   No current facility-administered medications on file prior to visit.    Review of Systems:  As per HPI- otherwise negative.   Physical Examination: There were no vitals filed for this visit. There were no vitals filed for this visit. There is no height or weight on file to calculate BMI. Ideal Body Weight:  GEN: no acute distress. HEENT: Atraumatic, Normocephalic.  Ears and Nose: No external deformity. CV: RRR, No M/G/R. No JVD. No thrill. No extra heart sounds. PULM: CTA B, no wheezes, crackles, rhonchi. No retractions. No resp. distress. No accessory muscle use. ABD: S, NT, ND, +BS. No rebound. No HSM. EXTR: No c/c/e PSYCH: Normally interactive. Conversant.    Assessment and Plan: ***  Signed Lamar Blinks, MD

## 2022-07-15 ENCOUNTER — Ambulatory Visit: Payer: PPO | Admitting: Family Medicine

## 2022-07-15 DIAGNOSIS — Z125 Encounter for screening for malignant neoplasm of prostate: Secondary | ICD-10-CM

## 2022-07-15 DIAGNOSIS — R7303 Prediabetes: Secondary | ICD-10-CM

## 2022-07-15 DIAGNOSIS — Z13220 Encounter for screening for lipoid disorders: Secondary | ICD-10-CM

## 2022-07-15 DIAGNOSIS — Z130 Encounter for screening for diseases of the blood and blood-forming organs and certain disorders involving the immune mechanism: Secondary | ICD-10-CM

## 2022-07-15 DIAGNOSIS — R918 Other nonspecific abnormal finding of lung field: Secondary | ICD-10-CM

## 2022-07-15 DIAGNOSIS — R001 Bradycardia, unspecified: Secondary | ICD-10-CM

## 2022-07-15 DIAGNOSIS — G43109 Migraine with aura, not intractable, without status migrainosus: Secondary | ICD-10-CM

## 2022-07-29 ENCOUNTER — Ambulatory Visit (INDEPENDENT_AMBULATORY_CARE_PROVIDER_SITE_OTHER): Payer: PPO

## 2022-07-29 DIAGNOSIS — I442 Atrioventricular block, complete: Secondary | ICD-10-CM | POA: Diagnosis not present

## 2022-07-29 LAB — CUP PACEART REMOTE DEVICE CHECK
Battery Remaining Longevity: 168 mo
Battery Remaining Percentage: 100 %
Brady Statistic RA Percent Paced: 30 %
Brady Statistic RV Percent Paced: 100 %
Date Time Interrogation Session: 20240403034300
Implantable Lead Connection Status: 753985
Implantable Lead Connection Status: 753985
Implantable Lead Implant Date: 20230627
Implantable Lead Implant Date: 20230627
Implantable Lead Location: 753859
Implantable Lead Location: 753860
Implantable Lead Model: 3830
Implantable Lead Model: 7841
Implantable Lead Serial Number: 1293503
Implantable Pulse Generator Implant Date: 20230627
Lead Channel Impedance Value: 577 Ohm
Lead Channel Impedance Value: 635 Ohm
Lead Channel Pacing Threshold Amplitude: 0.6 V
Lead Channel Pacing Threshold Amplitude: 0.9 V
Lead Channel Pacing Threshold Pulse Width: 0.4 ms
Lead Channel Pacing Threshold Pulse Width: 0.4 ms
Lead Channel Setting Pacing Amplitude: 2 V
Lead Channel Setting Pacing Amplitude: 2.5 V
Lead Channel Setting Pacing Pulse Width: 0.4 ms
Lead Channel Setting Sensing Sensitivity: 2.5 mV
Pulse Gen Serial Number: 585966
Zone Setting Status: 755011

## 2022-08-09 NOTE — Progress Notes (Unsigned)
Jeff Sheppard [address] [place], [place] [address] [phone]  Date:  08/12/2022   Name:  Jeff Sheppard   DOB:  08/30/1955   MRN:  [id number]  PCP:  Jeff Cables, MD    Chief Complaint: No chief complaint on file.   History of Present Illness:  Jeff Sheppard is a 67 y.o. very pleasant male patient who presents with the following:  Patient seen today for physical-history of long-term tobacco use, prediabetes, COPD, chronic migraine Most recent visit with myself was in June-at that time when he has significant bradycardia.  He then followed up with cardiology, had a pacemaker placed for advanced AV block He saw Dr Lewayne Bunting most recently in [place] well with pacemaker, blood pressure under good control  He has had good results using Topamax for migraine prophylaxis  Lab Results  Component Value Date   HGBA1C 6.1 07/14/2021   Colon cancer screening Recommend Shingrix Lung cancer screening -completed 1 year ago, this led to a 59-month follow-up in November.  Another 81-month follow-up was recommended at that time 03/02/2022: IMPRESSION: 1. Lung-RADS 3, probably benign findings. Short-term follow-up in 6 months is recommended with repeat low-dose chest CT without contrast (please use the following order, CT CHEST LCS NODULE FOLLOW-UP W/O CM). 2. Numerous new bilateral solid pulmonary nodules are seen, largest measures 4.6 mm. Previously seen bilateral solid pulmonary nodules have resolved. 3. Aortic Atherosclerosis (ICD10-I70.0) and Emphysema (ICD10-J43.9).  Aspirin 81 Topamax Patient Active Problem List   Diagnosis Date Noted   Migraines 01/01/2022   Chicken pox 01/01/2022   Complete heart block 10/20/2021   Prediabetes 07/14/2021   Tobacco abuse 07/14/2021   COPD (chronic obstructive pulmonary disease) 07/14/2021    Past Medical History:  Diagnosis Date   Chicken pox    Migraines      Past Surgical History:  Procedure Laterality Date   APPENDECTOMY     30 years ago   CATARACT EXTRACTION  2018   INNER EAR SURGERY     pt states he could not here   PACEMAKER IMPLANT N/A 10/21/2021   Procedure: PACEMAKER IMPLANT;  Surgeon: Marinus Maw, MD;  [facility];  Service: Cardiovascular;  Laterality: N/A;    Social History   Tobacco Use   Smoking status: Every Day    Packs/day: 1.00    Years: 60.00    Additional pack years: 0.00    Total pack years: 60.00    Types: Cigarettes   Smokeless tobacco: Never  Vaping Use   Vaping Use: Never used  Substance Use Topics   Alcohol use: Never   Drug use: Never    Family History  Problem Relation Age of Onset   Cancer Mother    Breast cancer Mother    Early death Mother     No Known Allergies  Medication list has been reviewed and updated.  Current Outpatient Medications on File Prior to Visit  Medication Sig Dispense Refill   aspirin EC 81 MG tablet Take 1 tablet (81 mg total) by mouth daily. Swallow whole. 30 tablet 12   COVID-19 mRNA vaccine 2023-2024 (COMIRNATY) SUSP injection Inject into the muscle. 0.3 mL 0   topiramate (TOPAMAX) 25 MG tablet TAKE 1 TABLET BY MOUTH TWICE DAILY FOR  MIGRAINE  PREVENTION 180 tablet 0   Ubrogepant (UBRELVY) 50 MG TABS Take 50 mg as needed for migraine headache.  May repeat in  one hour- max /24 hours 30 tablet 3   No current facility-administered medications on file prior to visit.    Review of Systems:  As per HPI- otherwise negative.   Physical Examination: There were no vitals filed for this visit. There were no vitals filed for this visit. There is no height or weight on file to calculate BMI. Ideal Body Weight:    GEN: no acute distress. HEENT: Atraumatic, Normocephalic.  Ears and Nose: No external deformity. CV: RRR, No M/G/R. No JVD. No thrill. No extra heart sounds. PULM: CTA B, no wheezes, crackles, rhonchi. No retractions. No resp.  distress. No accessory muscle use. ABD: S, NT, ND, +BS. No rebound. No HSM. EXTR: No c/c/e PSYCH: Normally interactive. Conversant.    Assessment and Plan: *** Physical exam.  Encouraged healthy diet and exercise routine Encourage patient to quit smoking Signed Jeff Amsterdam, MD

## 2022-08-09 NOTE — Patient Instructions (Signed)
It was good to see you again today, I will be in touch with your labs soon as possible  Recommend the shingles vaccine series at your pharmacy

## 2022-08-12 ENCOUNTER — Encounter: Payer: Self-pay | Admitting: Family Medicine

## 2022-08-12 ENCOUNTER — Ambulatory Visit (INDEPENDENT_AMBULATORY_CARE_PROVIDER_SITE_OTHER): Payer: PPO | Admitting: Family Medicine

## 2022-08-12 VITALS — BP 128/70 | HR 60 | Temp 97.7°F | Resp 18

## 2022-08-12 DIAGNOSIS — R7303 Prediabetes: Secondary | ICD-10-CM

## 2022-08-12 DIAGNOSIS — J42 Unspecified chronic bronchitis: Secondary | ICD-10-CM

## 2022-08-12 DIAGNOSIS — I442 Atrioventricular block, complete: Secondary | ICD-10-CM

## 2022-08-12 DIAGNOSIS — G43109 Migraine with aura, not intractable, without status migrainosus: Secondary | ICD-10-CM | POA: Diagnosis not present

## 2022-08-12 DIAGNOSIS — Z130 Encounter for screening for diseases of the blood and blood-forming organs and certain disorders involving the immune mechanism: Secondary | ICD-10-CM | POA: Diagnosis not present

## 2022-08-12 DIAGNOSIS — R972 Elevated prostate specific antigen [PSA]: Secondary | ICD-10-CM

## 2022-08-12 DIAGNOSIS — R0989 Other specified symptoms and signs involving the circulatory and respiratory systems: Secondary | ICD-10-CM

## 2022-08-12 DIAGNOSIS — Z0000 Encounter for general adult medical examination without abnormal findings: Secondary | ICD-10-CM | POA: Diagnosis not present

## 2022-08-12 DIAGNOSIS — R918 Other nonspecific abnormal finding of lung field: Secondary | ICD-10-CM

## 2022-08-12 DIAGNOSIS — Z13220 Encounter for screening for lipoid disorders: Secondary | ICD-10-CM | POA: Diagnosis not present

## 2022-08-12 DIAGNOSIS — Z125 Encounter for screening for malignant neoplasm of prostate: Secondary | ICD-10-CM

## 2022-08-12 LAB — LIPID PANEL
Cholesterol: 149 mg/dL (ref 0–200)
HDL: 45.8 mg/dL (ref >39.00)
LDL Cholesterol: 89 mg/dL (ref 0–99)
NonHDL: 103.05
Total CHOL/HDL Ratio: 3
Triglycerides: 68 mg/dL (ref 0.0–149.0)
VLDL: 13.6 mg/dL (ref 0.0–40.0)

## 2022-08-12 LAB — CBC
HCT: 46.6 % (ref 39.0–52.0)
Hemoglobin: 15.5 g/dL (ref 13.0–17.0)
MCHC: 33.3 g/dL (ref 30.0–36.0)
MCV: 88.3 fl (ref 78.0–100.0)
Platelets: 222 10*3/uL (ref 150.0–400.0)
RBC: 5.27 Mil/uL (ref 4.22–5.81)
RDW: 14.2 % (ref 11.5–15.5)
WBC: 6.3 10*3/uL (ref 4.0–10.5)

## 2022-08-12 LAB — COMPREHENSIVE METABOLIC PANEL
ALT: 13 U/L (ref 0–53)
AST: 15 U/L (ref 0–37)
Albumin: 4.3 g/dL (ref 3.5–5.2)
Alkaline Phosphatase: 58 U/L (ref 39–117)
BUN: 18 mg/dL (ref 6–23)
CO2: 24 mEq/L (ref 19–32)
Calcium: 9.2 mg/dL (ref 8.4–10.5)
Chloride: 108 mEq/L (ref 96–112)
Creatinine, Ser: 0.98 mg/dL (ref 0.40–1.50)
GFR: 79.84 mL/min (ref >60.00)
Glucose, Bld: 112 mg/dL — ABNORMAL HIGH (ref 70–99)
Potassium: 4.3 mEq/L (ref 3.5–5.1)
Sodium: 139 mEq/L (ref 135–145)
Total Bilirubin: 0.3 mg/dL (ref 0.2–1.2)
Total Protein: 7.1 g/dL (ref 6.0–8.3)

## 2022-08-12 LAB — HEMOGLOBIN A1C: Hgb A1c MFr Bld: 6 % (ref 4.6–6.5)

## 2022-08-12 LAB — PSA: PSA: 3.47 ng/mL (ref 0.10–4.00)

## 2022-08-12 MED ORDER — PREDNISONE 20 MG PO TABS: ORAL_TABLET | ORAL | 0 refills | Status: DC

## 2022-08-12 MED ORDER — TIOTROPIUM BROMIDE MONOHYDRATE 18 MCG IN CAPS: 18.0000 ug | ORAL_CAPSULE | Freq: Every day | RESPIRATORY_TRACT | 12 refills | Status: DC

## 2022-08-12 NOTE — Addendum Note (Signed)
Addended by: Abbe Amsterdam C on: 08/12/2022 04:22 PM   Modules accepted: Orders

## 2022-08-26 ENCOUNTER — Telehealth: Payer: Self-pay | Admitting: Licensed Clinical Social Worker

## 2022-08-26 NOTE — Patient Outreach (Signed)
  Care Coordination   08/26/2022 Name: Jeff Sheppard MRN: [id number] DOB: 1956/01/04   Care Coordination Outreach Attempts:  An unsuccessful telephone outreach was attempted today to offer the patient information about available care coordination services.  Follow Up Plan:  Additional outreach attempts will be made to offer the patient care coordination information and services.   Encounter Outcome:  No Answer   Care Coordination Interventions:  No, not indicated    Sammuel Hines, LCSW Social Work Care Coordination  [facility]ie [facility] [employer] [phone]

## 2022-09-04 NOTE — Progress Notes (Signed)
Remote pacemaker transmission.   

## 2022-09-08 ENCOUNTER — Ambulatory Visit: Payer: PPO | Admitting: Licensed Clinical Social Worker

## 2022-09-08 ENCOUNTER — Other Ambulatory Visit: Payer: Self-pay | Admitting: Family Medicine

## 2022-09-08 DIAGNOSIS — G43109 Migraine with aura, not intractable, without status migrainosus: Secondary | ICD-10-CM

## 2022-09-08 NOTE — Patient Outreach (Signed)
  Care Coordination  Initial Visit Note   09/08/2022 Name: Jeff Sheppard MRN: [id number] DOB: 07-25-1955  Jeff Sheppard is a 67 y.o. year old male who sees Jeff Sheppard, Jeff Found, MD for primary care. I spoke with  Jeff Sheppard by phone today.  What matters to the patients health and wellness today?  Understanding his COPD    Goals Addressed             This Visit's Progress    COMPLETED: Care Coordination Activities       Activities and task to complete in order to accomplish goals.   Keep all upcoming appointment discussed today Continue with compliance of taking medication prescribed by Doctor I have scheduled a phone appointment with the RN Care Manager she will provide educational informational and assist you with managing your health needs        SDOH assessments and interventions completed:  Yes  SDOH Interventions Today    Flowsheet Row Most Recent Value  SDOH Interventions   Food Insecurity Interventions Intervention Not Indicated  Transportation Interventions Intervention Not Indicated  Utilities Interventions Intervention Not Indicated  Financial Strain Interventions Intervention Not Indicated  Stress Interventions Intervention Not Indicated       Care Coordination Interventions:  Yes, provided  Interventions Today    Flowsheet Row Most Recent Value  Chronic Disease   Chronic disease during today's visit Chronic Obstructive Pulmonary Disease (COPD)  General Interventions   General Interventions Discussed/Reviewed General Interventions Discussed, Referral to Nurse  [facility] Care Coordination program]  Education Interventions   Education Provided Provided Education  Mental Health Interventions   Mental Health Discussed/Reviewed Mental Health Reviewed  [no needs identified]       Follow up plan: No further intervention required by Social Work Referral made to RN for education and understanding COPD  Encounter Outcome:  Pt. Visit Completed    Jeff Hines, LCSW Social Work Care Coordination  [facility] Jeff Sheppard [phone]

## 2022-09-08 NOTE — Patient Instructions (Signed)
Visit Information  Thank you for taking time to visit with me today. Please don't hesitate to contact me if I can be of assistance to you.   Following are the goals we discussed today:   Goals Addressed             This Visit's Progress    COMPLETED: Care Coordination Activities       Activities and task to complete in order to accomplish goals.   Keep all upcoming appointment discussed today Continue with compliance of taking medication prescribed by Doctor I have scheduled a phone appointment with the RN Care Manager she will provide educational informational and assist you with managing your health needs         next appointment is by telephone on 09/24/22 at 3:15 with the nurse care manager  Please call the care guide team at [phone] if you need to cancel or reschedule your appointment.    Patient verbalizes understanding of instructions and care plan provided today and agrees to view in MyChart. Active MyChart status and patient understanding of how to access instructions and care plan via MyChart confirmed with patient.     Sammuel Hines, LCSW Social Work Care Coordination  [facility] [employer] [phone]

## 2022-09-24 ENCOUNTER — Ambulatory Visit (HOSPITAL_BASED_OUTPATIENT_CLINIC_OR_DEPARTMENT_OTHER)
Admission: RE | Admit: 2022-09-24 | Discharge: 2022-09-24 | Disposition: A | Discharge status: Home / Self Care | Payer: PPO | Source: Ambulatory Visit | Attending: Family Medicine | Admitting: Family Medicine

## 2022-09-24 ENCOUNTER — Ambulatory Visit: Payer: Self-pay

## 2022-09-24 DIAGNOSIS — R918 Other nonspecific abnormal finding of lung field: Secondary | ICD-10-CM | POA: Insufficient documentation

## 2022-09-24 DIAGNOSIS — Z87891 Personal history of nicotine dependence: Secondary | ICD-10-CM | POA: Diagnosis not present

## 2022-09-24 NOTE — Patient Instructions (Signed)
Visit Information  Thank you for taking time to visit with me today. Please don't hesitate to contact me if I can be of assistance to you.   Following are the goals we discussed today:  Continue to take medications as prescribed Continue to attend provider visits as recommended/scheduled Review educational material sent via mail: Chronic obstructive pulmonary disease; Exacerbation of COPD  Our next appointment is by telephone on 10/23/22 at 1:00 pm  Please call the care guide team at [phone] if you need to cancel or reschedule your appointment.   If you are experiencing a Mental Health or Behavioral Health Crisis or need someone to talk to, please call the Suicide and Crisis Lifeline: 90  Kathyrn Sheriff, RN, MSN, BSN, CCM [facility] Care Coordinator [phone]

## 2022-09-24 NOTE — Patient Outreach (Signed)
  Care Coordination   Initial Visit Note   09/24/2022 Name: OKECHUKWU GUIDOS MRN: [id number] DOB: 07-30-1955  RENAY LANGENBERG is a 67 y.o. year old male who sees Copland, Gwenlyn Found, MD for primary care. I spoke with  [name] by phone today.  What matters to the patients health and wellness today?  COPD education. Patient denies any questions or concerns at this time.  Goals Addressed             This Visit's Progress    COPD education       Interventions Today    Flowsheet Row Most Recent Value  Chronic Disease   Chronic disease during today's visit Chronic Obstructive Pulmonary Disease (COPD)  General Interventions   General Interventions Discussed/Reviewed General Interventions Discussed, Doctor Visits  Doctor Visits Discussed/Reviewed Doctor Visits Reviewed  [reviewed upcoming appointments]  Education Interventions   Education Provided Provided Education, Provided Web-based Education  [Emmi COPD education: Chronic obstructive pulmonary disease,  exacerbation of COPD]  Provided Verbal Education On --  [discussed COPD action plan and when to call the doctor]  Nutrition Interventions   Nutrition Discussed/Reviewed Nutrition Discussed  [emcouraged to eat healthy]  Pharmacy Interventions   Pharmacy Dicussed/Reviewed Pharmacy Topics Discussed  [medications reviewed]            SDOH assessments and interventions completed:  No SDOH Interventions Today    Flowsheet Row Most Recent Value  SDOH Interventions   Housing Interventions Intervention Not Indicated     Care Coordination Interventions:  Yes, provided   Follow up plan: Follow up call scheduled for 10/23/22    Encounter Outcome:  Pt. Visit Completed   Kathyrn Sheriff, RN, MSN, BSN, CCM [facility] Care Coordinator [phone]

## 2022-09-29 ENCOUNTER — Encounter: Payer: Self-pay | Admitting: Family Medicine

## 2022-10-23 ENCOUNTER — Ambulatory Visit: Payer: Self-pay

## 2022-10-23 NOTE — Patient Instructions (Signed)
Visit Information  Thank you for taking time to visit with me today. Please don't hesitate to contact me if I can be of assistance to you.   Following are the goals we discussed today:  Review educational material sent via mail. Contact RN Care Coordinator if you have any questions. Continue to take medications as prescribed Continue to attend provider visits as scheduled Contact your provider with health questions or concerns as needed Ask you provider if you could benefit from a rescue inhaler Resource for smoking cessation 1-800-quit-now.   Our next appointment is by telephone on 12/23/22 at 9:45 am  Please call the care guide team at [phone] if you need to cancel or reschedule your appointment.   If you are experiencing a Mental Health or Behavioral Health Crisis or need someone to talk to, please call the Suicide and Crisis Lifeline: 52  Kathyrn Sheriff, RN, MSN, BSN, CCM [facility] Care Coordinator [phone]

## 2022-10-23 NOTE — Patient Outreach (Signed)
  Care Coordination   Follow Up Visit Note   10/23/2022 Name: Jeff Sheppard MRN: [id number] DOB: 1955/11/24  Jeff Sheppard is a 67 y.o. year old male who sees Copland, Gwenlyn Found, MD for primary care. I spoke with  Jeff Sheppard  and spouse(on speaker) by phone today.  What matters to the patients health and wellness today?  Jeff Sheppard reports he is doing well. He states he continues with successful smoking cessation. He reports he received education articles sent by [facility] previously. He denies any questions. He is receptive to additional COPD education.   Goals Addressed             This Visit's Progress    COPD education       Interventions Today    Flowsheet Row Most Recent Value  Chronic Disease   Chronic disease during today's visit Chronic Obstructive Pulmonary Disease (COPD)  General Interventions   General Interventions Discussed/Reviewed General Interventions Reviewed, Doctor Visits  Doctor Visits Discussed/Reviewed Doctor Visits Discussed  Education Interventions   Education Provided Provided Education, Provided Printed Education  [discussed COPD action plan, discussed Smoking cessation. offered 1-800-quit now resource.]  Provided Verbal Education On Other  [discussed COPD action plan, triggers, provided education: chronic bronchitis,  COPD exacerbation,  medicines for COPD]  Pharmacy Interventions   Pharmacy Dicussed/Reviewed Pharmacy Topics Reviewed, Medications and their functions            SDOH assessments and interventions completed:  No  Care Coordination Interventions:  Yes, provided   Follow up plan: Follow up call scheduled for 12/23/22    Encounter Outcome:  Pt. Visit Completed  Kathyrn Sheriff, RN, MSN, BSN, CCM [facility] Coordinator [phone]

## 2022-10-28 ENCOUNTER — Ambulatory Visit (INDEPENDENT_AMBULATORY_CARE_PROVIDER_SITE_OTHER): Payer: PPO

## 2022-10-28 DIAGNOSIS — I442 Atrioventricular block, complete: Secondary | ICD-10-CM | POA: Diagnosis not present

## 2022-10-28 LAB — CUP PACEART REMOTE DEVICE CHECK
Battery Remaining Longevity: 168 mo
Battery Remaining Percentage: 100 %
Brady Statistic RA Percent Paced: 32 %
Brady Statistic RV Percent Paced: 100 %
Date Time Interrogation Session: 20240703033000
Implantable Lead Connection Status: 753985
Implantable Lead Connection Status: 753985
Implantable Lead Implant Date: 20230627
Implantable Lead Implant Date: 20230627
Implantable Lead Location: 753859
Implantable Lead Location: 753860
Implantable Lead Model: 3830
Implantable Lead Model: 7841
Implantable Lead Serial Number: 1293503
Implantable Pulse Generator Implant Date: 20230627
Lead Channel Impedance Value: 600 Ohm
Lead Channel Impedance Value: 699 Ohm
Lead Channel Pacing Threshold Amplitude: 0.6 V
Lead Channel Pacing Threshold Amplitude: 0.7 V
Lead Channel Pacing Threshold Pulse Width: 0.4 ms
Lead Channel Pacing Threshold Pulse Width: 0.4 ms
Lead Channel Setting Pacing Amplitude: 2 V
Lead Channel Setting Pacing Amplitude: 2.5 V
Lead Channel Setting Pacing Pulse Width: 0.4 ms
Lead Channel Setting Sensing Sensitivity: 2.5 mV
Pulse Gen Serial Number: 585966
Zone Setting Status: 755011

## 2022-11-19 NOTE — Progress Notes (Signed)
Remote pacemaker transmission.   

## 2022-12-08 ENCOUNTER — Other Ambulatory Visit: Payer: Self-pay | Admitting: Family Medicine

## 2022-12-08 DIAGNOSIS — G43109 Migraine with aura, not intractable, without status migrainosus: Secondary | ICD-10-CM

## 2022-12-16 NOTE — Progress Notes (Signed)
Pt did not answer phone for AWV.   This encounter was created in error - please disregard.

## 2022-12-23 ENCOUNTER — Telehealth: Payer: Self-pay

## 2022-12-23 NOTE — Patient Outreach (Signed)
  Care Coordination   12/23/2022 Name: Jeff Sheppard MRN: [id number] DOB: Jun 18, 1955   Care Coordination Outreach Attempts:  An unsuccessful telephone outreach was attempted for a scheduled appointment today.  Follow Up Plan:  Additional outreach attempts will be made to offer the patient care coordination information and services.   Encounter Outcome:  No Answer   Care Coordination Interventions:  No, not indicated    Kathyrn Sheriff, RN, MSN, BSN, CCM Care Management Coordinator [phone]

## 2023-01-27 ENCOUNTER — Ambulatory Visit (INDEPENDENT_AMBULATORY_CARE_PROVIDER_SITE_OTHER): Payer: PPO

## 2023-01-27 DIAGNOSIS — I442 Atrioventricular block, complete: Secondary | ICD-10-CM | POA: Diagnosis not present

## 2023-01-27 LAB — CUP PACEART REMOTE DEVICE CHECK
Battery Remaining Longevity: 168 mo
Battery Remaining Percentage: 100 %
Brady Statistic RA Percent Paced: 32 %
Brady Statistic RV Percent Paced: 100 %
Date Time Interrogation Session: 20241002033100
Implantable Lead Connection Status: 753985
Implantable Lead Connection Status: 753985
Implantable Lead Implant Date: 20230627
Implantable Lead Implant Date: 20230627
Implantable Lead Location: 753859
Implantable Lead Location: 753860
Implantable Lead Model: 3830
Implantable Lead Model: 7841
Implantable Lead Serial Number: 1293503
Implantable Pulse Generator Implant Date: 20230627
Lead Channel Impedance Value: 613 Ohm
Lead Channel Impedance Value: 724 Ohm
Lead Channel Pacing Threshold Amplitude: 0.6 V
Lead Channel Pacing Threshold Amplitude: 1 V
Lead Channel Pacing Threshold Pulse Width: 0.4 ms
Lead Channel Pacing Threshold Pulse Width: 0.4 ms
Lead Channel Setting Pacing Amplitude: 2 V
Lead Channel Setting Pacing Amplitude: 2.5 V
Lead Channel Setting Pacing Pulse Width: 0.4 ms
Lead Channel Setting Sensing Sensitivity: 2.5 mV
Pulse Gen Serial Number: 585966
Zone Setting Status: 755011

## 2023-01-30 NOTE — Progress Notes (Deleted)
  Cardiology Office Note:  .   Date:  01/30/2023  ID:  Jeff Sheppard, DOB 03-27-1956, MRN [id number] PCP: Pearline Cables, MD  [facility] Providers Cardiologist:  Dr. Eden Emms EP: Dr. Ladona Ridgel  History of Present Illness: .   Jeff Sheppard is a 67 y.o. male w/PMHx of COPD, Migraine HAs, RBBB, CHB w/PPM  He saw Dr. Ladona Ridgel  Oct 2023, still smoking, acute outputs reduced, doing well.   Today's visit is scheduled as an annual visit  ROS: ***  *** symptoms *** labs, lipids...   Device information BSCi dual chamber PPM implanted 10/21/21 (RV lead in LB position)   Studies Reviewed: Marland Kitchen    EKG done today and reviewed by myself:  ***  DEVICE interrogation done today and reviewed by myself *** Battery and lead measurements are good ***   10/20/21: TTE 1. Patient in complete heart block . Left ventricular ejection fraction,  by estimation, is 60 to 65%. The left ventricle has normal function. The  left ventricle has no regional wall motion abnormalities. Left ventricular  diastolic parameters are  indeterminate. The average left ventricular global longitudinal strain is  -28.5 %. The global longitudinal strain is normal.   2. Right ventricular systolic function is normal. The right ventricular  size is normal.   3. The mitral valve is normal in structure. No evidence of mitral valve  regurgitation. No evidence of mitral stenosis.   4. The aortic valve is normal in structure. Aortic valve regurgitation is  not visualized. No aortic stenosis is present.   5. Aortic dilatation noted. There is mild dilatation of the aortic root.   6. The inferior vena cava is normal in size with greater than 50%  respiratory variability, suggesting right atrial pressure of 3 mmHg.    Risk Assessment/Calculations:    Physical Exam:   VS:  There were no vitals taken for this visit.   Wt Readings from Last 3 Encounters:  01/30/22 158 lb (71.7 kg)  12/08/21 154 lb (69.9 kg)   10/20/21 154 lb 15.7 oz (70.3 kg)    GEN: Well nourished, well developed in no acute distress NECK: No JVD; No carotid bruits CARDIAC: ***RRR, no murmurs, rubs, gallops RESPIRATORY:  *** CTA b/l without rales, wheezing or rhonchi  ABDOMEN: Soft, non-tender, non-distended EXTREMITIES:  No edema; No deformity   PPM site: *** is stable, no thinning, fluctuation, tethering  ASSESSMENT AND PLAN: .    PPM ***  HTN ***     {Are you ordering a CV Procedure (e.g. stress test, cath, DCCV, TEE, etc)?   Press F2        :[id number]}     Dispo: ***  Signed, Sheilah Pigeon, PA-C

## 2023-02-02 ENCOUNTER — Ambulatory Visit: Payer: PPO | Admitting: Physician Assistant

## 2023-02-04 ENCOUNTER — Ambulatory Visit: Payer: PPO

## 2023-02-04 DIAGNOSIS — Z0000 Encounter for general adult medical examination without abnormal findings: Secondary | ICD-10-CM | POA: Diagnosis not present

## 2023-02-04 NOTE — Progress Notes (Signed)
Subjective:   Jeff Sheppard is a 67 y.o. male who presents for Medicare Annual/Subsequent preventive examination.  Visit Complete: Virtual I connected with  [name] on 02/04/23 by a audio enabled telemedicine application and verified that I am speaking with the correct person using two identifiers.  Patient Location: Home  Provider Location: Office/Clinic  I discussed the limitations of evaluation and management by telemedicine. The patient expressed understanding and agreed to proceed.  Vital Signs: Because this visit was a virtual/telehealth visit, some criteria may be missing or patient reported. Any vitals not documented were not able to be obtained and vitals that have been documented are patient reported.  Cardiac Risk Factors include: advanced age (>54men, >82 women);male gender     Objective:    There were no vitals filed for this visit. There is no height or weight on file to calculate BMI.     02/04/2023   10:36 AM 12/08/2021   11:15 AM 10/20/2021    8:00 PM  Advanced Directives  Does Patient Have a Medical Advance Directive? No No No  Would patient like information on creating a medical advance directive? No - Patient declined  No - Patient declined    Current Medications (verified) Outpatient Encounter Medications as of 02/04/2023  Medication Sig   aspirin EC 81 MG tablet Take 1 tablet (81 mg total) by mouth daily. Swallow whole.   tiotropium (SPIRIVA HANDIHALER) 18 MCG inhalation capsule Place 1 capsule (18 mcg total) into inhaler and inhale daily.   topiramate (TOPAMAX) 25 MG tablet TAKE 1 TABLET BY MOUTH TWICE DAILY FOR MIGRAINE PREVENTION   predniSONE (DELTASONE) 20 MG tablet Take 40 mg daily for 3 days, then 20 mg daily for 3 days (Patient not taking: Reported on 09/24/2022)   Ubrogepant (UBRELVY) 50 MG TABS Take 50 mg as needed for migraine headache.  May repeat in one hour- max 200mg /24 hours (Patient not taking: Reported on 09/24/2022)   No  facility-administered encounter medications on file as of 02/04/2023.    Allergies (verified) Patient has no known allergies.   History: Past Medical History:  Diagnosis Date   Chicken pox    Migraines    Past Surgical History:  Procedure Laterality Date   APPENDECTOMY     30 years ago   CATARACT EXTRACTION  2018   INNER EAR SURGERY     pt states he could not here   PACEMAKER IMPLANT N/A 10/21/2021   Procedure: PACEMAKER IMPLANT;  Surgeon: Marinus Maw, MD;  Location: [facility] INVASIVE CV LAB;  Service: Cardiovascular;  Laterality: N/A;   Family History  Problem Relation Age of Onset   Cancer Mother    Breast cancer Mother    Early death Mother    Social History   Socioeconomic History   Marital status: Married    Spouse name: Not on file   Number of children: 3   Years of education: 8   Highest education level: Not on file  Occupational History   Not on file  Tobacco Use   Smoking status: Every Day    Current packs/day: 1.00    Average packs/day: 1 pack/day for 60.0 years (60.0 ttl pk-yrs)    Types: Cigarettes   Smokeless tobacco: Never  Vaping Use   Vaping status: Never Used  Substance and Sexual Activity   Alcohol use: Never   Drug use: Never   Sexual activity: Not Currently  Other Topics Concern   Not on file  Social History Narrative  Not on file   Social Determinants of Health   Financial Resource Strain: Low Risk  (02/04/2023)   Overall Financial Resource Strain (CARDIA)    Difficulty of Paying Living Expenses: Not hard at all  Food Insecurity: No Food Insecurity (02/04/2023)   Hunger Vital Sign    Worried About Running Out of Food in the Last Year: Never true    Ran Out of Food in the Last Year: Never true  Transportation Needs: No Transportation Needs (02/04/2023)   PRAPARE - Administrator, Civil Service (Medical): No    Lack of Transportation (Non-Medical): No  Physical Activity: Sufficiently Active (02/04/2023)   Exercise Vital  Sign    Days of Exercise per Week: 7 days    Minutes of Exercise per Session: 30 min  Stress: No Stress Concern Present (02/04/2023)   [employer] of Occupational Health - Occupational Stress Questionnaire    Feeling of Stress : Not at all  Social Connections: Moderately Integrated (02/04/2023)   Social Connection and Isolation Panel [NHANES]    Frequency of Communication with Friends and Family: More than three times a week    Frequency of Social Gatherings with Friends and Family: Once a week    Attends Religious Services: More than 4 times per year    Active Member of [employer] or Organizations: No    Attends Engineer, structural: Never    Marital Status: Married    Tobacco Counseling Ready to quit: Not Answered Counseling given: Not Answered   Clinical Intake:  Pre-visit preparation completed: Yes  Pain : No/denies pain     Nutritional Status: BMI of 19-24  Normal Nutritional Risks: None Diabetes: No  How often do you need to have someone help you when you read instructions, pamphlets, or other written materials from your doctor or pharmacy?: 1 - Never  Interpreter Needed?: No  Information entered by :: Kennedy Bucker, LPN   Activities of Daily Living    02/04/2023   10:36 AM  In your present state of health, do you have any difficulty performing the following activities:  Hearing? 0  Vision? 0  Difficulty concentrating or making decisions? 0  Walking or climbing stairs? 0  Dressing or bathing? 0  Doing errands, shopping? 0  Preparing Food and eating ? N  Using the Toilet? N  In the past six months, have you accidently leaked urine? N  Do you have problems with loss of bowel control? N  Managing your Medications? N  Managing your Finances? N  Housekeeping or managing your Housekeeping? N    Patient Care Team: Copland, Gwenlyn Found, MD as PCP - General (Family Medicine)  Indicate any recent Medical Services you may have received from other than  [facility] providers in the past year (date may be approximate).     Assessment:   This is a routine wellness examination for [place].  Hearing/Vision screen Hearing Screening - Comments:: No aids Vision Screening - Comments:: No glasses   Goals Addressed             This Visit's Progress    DIET - EAT MORE FRUITS AND VEGETABLES         Depression Screen    02/04/2023   10:34 AM 08/12/2022   10:26 AM 12/08/2021   11:19 AM 07/14/2021   10:20 AM  PHQ 2/9 Scores  PHQ - 2 Score 0 0 0 0  PHQ- 9 Score 0       Fall Risk  02/04/2023   10:36 AM 08/12/2022   10:26 AM 12/08/2021   11:17 AM 07/14/2021   10:20 AM  Fall Risk   Falls in the past year? 0 0 0 0  Number falls in past yr: 0 0 0 0  Injury with Fall? 0 0 0 0  Risk for fall due to : No Fall Risks No Fall Risks    Follow up Falls prevention discussed;Falls evaluation completed Falls evaluation completed Falls prevention discussed     MEDICARE RISK AT HOME: Medicare Risk at Home Any stairs in or around the home?: Yes If so, are there any without handrails?: No Home free of loose throw rugs in walkways, pet beds, electrical cords, etc?: Yes Adequate lighting in your home to reduce risk of falls?: Yes Life alert?: No Use of a cane, walker or w/c?: No Grab bars in the bathroom?: Yes Shower chair or bench in shower?: Yes Elevated toilet seat or a handicapped toilet?: No  TIMED UP AND GO:  Was the test performed?  No    Cognitive Function:        02/04/2023   10:37 AM  6CIT Screen  What Year? 0 points  What month? 0 points  What time? 0 points  Count back from 20 0 points  Months in reverse 4 points  Repeat phrase 4 points  Total Score 8 points    Immunizations Immunization History  Administered Date(s) Administered   PFIZER Comirnaty(Gray Top)Covid-19 Tri-Sucrose Vaccine 07/19/2019, 08/09/2019, 03/31/2020   PNEUMOCOCCAL CONJUGATE-20 07/14/2021   Pfizer(Comirnaty)Fall Seasonal Vaccine 12 years and older  02/04/2022   Tdap 05/26/2018    TDAP status: Up to date  Flu Vaccine status: Declined, Education has been provided regarding the importance of this vaccine but patient still declined. Advised may receive this vaccine at local pharmacy or Health Dept. Aware to provide a copy of the vaccination record if obtained from local pharmacy or Health Dept. Verbalized acceptance and understanding.  Pneumococcal vaccine status: Up to date  Covid-19 vaccine status: Completed vaccines  Qualifies for Shingles Vaccine? Yes   Zostavax completed No   Shingrix Completed?: No.    Education has been provided regarding the importance of this vaccine. Patient has been advised to call insurance company to determine out of pocket expense if they have not yet received this vaccine. Advised may also receive vaccine at local pharmacy or Health Dept. Verbalized acceptance and understanding.  Screening Tests Health Maintenance  Topic Date Due   Colonoscopy  Never done   Zoster Vaccines- Shingrix (1 of 2) Never done   INFLUENZA VACCINE  Never done   COVID-19 Vaccine (5 - 2023-24 season) 12/27/2022   Lung Cancer Screening  09/24/2023   Medicare Annual Wellness (AWV)  02/04/2024   DTaP/Tdap/Td (2 - Td or Tdap) 05/26/2028   Pneumonia Vaccine 59+ Years old  Completed   Hepatitis C Screening  Completed   HPV VACCINES  Aged Out    Health Maintenance  Health Maintenance Due  Topic Date Due   Colonoscopy  Never done   Zoster Vaccines- Shingrix (1 of 2) Never done   INFLUENZA VACCINE  Never done   COVID-19 Vaccine (5 - 2023-24 season) 12/27/2022    Declined referral for colonoscopy  Lung Cancer Screening: (Low Dose CT Chest recommended if Age 24-80 years, 20 pack-year currently smoking OR have quit w/in 15years.) does qualify.   Lung Cancer Screening Referral: had CT 09/24/22  Additional Screening:  Hepatitis C Screening: does qualify; Completed 07/14/21  Vision  Screening: Recommended annual ophthalmology  exams for early detection of glaucoma and other disorders of the eye. Is the patient up to date with their annual eye exam?  No  Who is the provider or what is the name of the office in which the patient attends annual eye exams? No one If pt is not established with a provider, would they like to be referred to a provider to establish care? No .   Dental Screening: Recommended annual dental exams for proper oral hygiene    Community Resource Referral / Chronic Care Management: CRR required this visit?  No   CCM required this visit?  No     Plan:     I have personally reviewed and noted the following in the patient's chart:   Medical and social history Use of alcohol, tobacco or illicit drugs  Current medications and supplements including opioid prescriptions. Patient is not currently taking opioid prescriptions. Functional ability and status Nutritional status Physical activity Advanced directives List of other physicians Hospitalizations, surgeries, and ER visits in previous 12 months Vitals Screenings to include cognitive, depression, and falls Referrals and appointments  In addition, I have reviewed and discussed with patient certain preventive protocols, quality metrics, and best practice recommendations. A written personalized care plan for preventive services as well as general preventive health recommendations were provided to patient.     Hal Hope, LPN   10/96/0454   After Visit Summary: (MyChart) Due to this being a telephonic visit, the after visit summary with patients personalized plan was offered to patient via MyChart   Nurse Notes: none

## 2023-02-04 NOTE — Patient Instructions (Addendum)
Jeff Sheppard , Thank you for taking time to come for your Medicare Wellness Visit. I appreciate your ongoing commitment to your health goals. Please review the following plan we discussed and let me know if I can assist you in the future.   Referrals/Orders/Follow-Ups/Clinician Recommendations: none  This is a list of the screening recommended for you and due dates:  Health Maintenance  Topic Date Due   Colon Cancer Screening  Never done   Zoster (Shingles) Vaccine (1 of 2) Never done   Flu Shot  Never done   COVID-19 Vaccine (5 - 2023-24 season) 12/27/2022   Screening for Lung Cancer  09/24/2023   Medicare Annual Wellness Visit  02/04/2024   DTaP/Tdap/Td vaccine (2 - Td or Tdap) 05/26/2028   Pneumonia Vaccine  Completed   Hepatitis C Screening  Completed   HPV Vaccine  Aged Out    Advanced directives: (ACP Link)Information on Advanced Care Planning can be found at [facility] [place] Advance Health Care Directives Advance Health Care Directives ([email or web address]   Next Medicare Annual Wellness Visit scheduled for next year: Yes   02/08/24 @ 11:00 am in person

## 2023-02-12 NOTE — Progress Notes (Signed)
Remote pacemaker transmission.   

## 2023-03-01 NOTE — Progress Notes (Unsigned)
  Cardiology Office Note:  .   Date:  03/01/2023  ID:  Jeff Sheppard, DOB 1955-08-13, MRN [id number] PCP: Pearline Cables, MD  [facility] Providers Cardiologist:  Dr. Eden Emms EP: Dr. Ladona Ridgel  History of Present Illness: .   Jeff Sheppard is a 67 y.o. male w/PMHx of COPD, Migraine HAs, RBBB, CHB w/PPM  He saw Dr. Ladona Ridgel  Oct 2023, still smoking, acute outputs reduced, doing well.   Today's visit is scheduled as an annual visit  ROS: ***  *** symptoms *** labs, lipids...   Device information BSCi dual chamber PPM implanted 10/21/21 (RV lead in LB position)   Studies Reviewed: Marland Kitchen    EKG done today and reviewed by myself:  ***  DEVICE interrogation done today and reviewed by myself *** Battery and lead measurements are good ***   10/20/21: TTE 1. Patient in complete heart block . Left ventricular ejection fraction,  by estimation, is 60 to 65%. The left ventricle has normal function. The  left ventricle has no regional wall motion abnormalities. Left ventricular  diastolic parameters are  indeterminate. The average left ventricular global longitudinal strain is  -28.5 %. The global longitudinal strain is normal.   2. Right ventricular systolic function is normal. The right ventricular  size is normal.   3. The mitral valve is normal in structure. No evidence of mitral valve  regurgitation. No evidence of mitral stenosis.   4. The aortic valve is normal in structure. Aortic valve regurgitation is  not visualized. No aortic stenosis is present.   5. Aortic dilatation noted. There is mild dilatation of the aortic root.   6. The inferior vena cava is normal in size with greater than 50%  respiratory variability, suggesting right atrial pressure of 3 mmHg.    Risk Assessment/Calculations:    Physical Exam:   VS:  There were no vitals taken for this visit.   Wt Readings from Last 3 Encounters:  01/30/22 158 lb (71.7 kg)  12/08/21 154 lb (69.9 kg)   10/20/21 154 lb 15.7 oz (70.3 kg)    GEN: Well nourished, well developed in no acute distress NECK: No JVD; No carotid bruits CARDIAC: ***RRR, no murmurs, rubs, gallops RESPIRATORY:  *** CTA b/l without rales, wheezing or rhonchi  ABDOMEN: Soft, non-tender, non-distended EXTREMITIES:  No edema; No deformity   PPM site: *** is stable, no thinning, fluctuation, tethering  ASSESSMENT AND PLAN: .    PPM ***  HTN ***     {Are you ordering a CV Procedure (e.g. stress test, cath, DCCV, TEE, etc)?   Press F2        :[id number]}     Dispo: ***  Signed, Sheilah Pigeon, PA-C

## 2023-03-04 ENCOUNTER — Ambulatory Visit: Payer: PPO | Attending: Physician Assistant | Admitting: Physician Assistant

## 2023-03-04 ENCOUNTER — Encounter: Payer: Self-pay | Admitting: Physician Assistant

## 2023-03-04 VITALS — BP 142/90 | HR 60 | Ht 67.0 in | Wt 171.0 lb

## 2023-03-04 DIAGNOSIS — I10 Essential (primary) hypertension: Secondary | ICD-10-CM | POA: Diagnosis not present

## 2023-03-04 DIAGNOSIS — Z950 Presence of cardiac pacemaker: Secondary | ICD-10-CM

## 2023-03-04 LAB — CUP PACEART INCLINIC DEVICE CHECK
Date Time Interrogation Session: 20241107114757
Implantable Lead Connection Status: 753985
Implantable Lead Connection Status: 753985
Implantable Lead Implant Date: 20230627
Implantable Lead Implant Date: 20230627
Implantable Lead Location: 753859
Implantable Lead Location: 753860
Implantable Lead Model: 3830
Implantable Lead Model: 7841
Implantable Lead Serial Number: 1293503
Implantable Pulse Generator Implant Date: 20230627
Lead Channel Impedance Value: 581 Ohm
Lead Channel Impedance Value: 750 Ohm
Lead Channel Pacing Threshold Amplitude: 1 V
Lead Channel Pacing Threshold Amplitude: 1 V
Lead Channel Pacing Threshold Pulse Width: 0.4 ms
Lead Channel Pacing Threshold Pulse Width: 0.4 ms
Lead Channel Sensing Intrinsic Amplitude: 7.5 mV
Lead Channel Setting Pacing Amplitude: 2 V
Lead Channel Setting Pacing Amplitude: 2.5 V
Lead Channel Setting Pacing Pulse Width: 0.4 ms
Lead Channel Setting Sensing Sensitivity: 2.5 mV
Pulse Gen Serial Number: 585966
Zone Setting Status: 755011

## 2023-03-04 NOTE — Patient Instructions (Signed)
Medication Instructions:   *If you need a refill on your cardiac medications before your next appointment, please call your pharmacy*   Lab Work:  If you have labs (blood work) drawn today and your tests are completely normal, you will receive your results only by: MyChart Message (if you have MyChart) OR A paper copy in the mail If you have any lab test that is abnormal or we need to change your treatment, we will call you to review the results.   Testing/Procedures:    Follow-Up: At [facility], you and your health needs are our priority.  As part of our continuing mission to provide you with exceptional heart care, we have created designated Provider Care Teams.  These Care Teams include your primary Cardiologist (physician) and Advanced Practice Providers (APPs -  Physician Assistants and Nurse Practitioners) who all work together to provide you with the care you need, when you need it.  We recommend signing up for the patient portal called "MyChart".  Sign up information is provided on this After Visit Summary.  MyChart is used to connect with patients for Virtual Visits (Telemedicine).  Patients are able to view lab/test results, encounter notes, upcoming appointments, etc.  Non-urgent messages can be sent to your provider as well.   To learn more about what you can do with MyChart, go to [email or web address].    Your next appointment:  one year follow up

## 2023-03-09 ENCOUNTER — Other Ambulatory Visit: Payer: Self-pay | Admitting: Family Medicine

## 2023-03-09 DIAGNOSIS — G43109 Migraine with aura, not intractable, without status migrainosus: Secondary | ICD-10-CM

## 2023-03-13 ENCOUNTER — Other Ambulatory Visit: Payer: Self-pay | Admitting: Family Medicine

## 2023-03-13 DIAGNOSIS — G43109 Migraine with aura, not intractable, without status migrainosus: Secondary | ICD-10-CM

## 2023-03-15 ENCOUNTER — Telehealth: Payer: Self-pay

## 2023-03-15 NOTE — Telephone Encounter (Signed)
PA initiated via Covermymeds; KEY: [id number]. Awaiting determination.

## 2023-03-22 NOTE — Telephone Encounter (Signed)
PA denied. Trial and failure or intolerance to one triptan or a contraindication to all triptans required.

## 2023-03-23 ENCOUNTER — Encounter: Payer: Self-pay | Admitting: Family Medicine

## 2023-04-12 ENCOUNTER — Other Ambulatory Visit: Payer: Self-pay | Admitting: Family Medicine

## 2023-04-12 DIAGNOSIS — G43109 Migraine with aura, not intractable, without status migrainosus: Secondary | ICD-10-CM

## 2023-04-24 NOTE — Patient Instructions (Incomplete)
It was great to see you again today Recommend COVID booster, shingles vaccine series/Shingrix if not up-to-date I do recommend screening for colon cancer and annual flu shot  Try the [name] inhaler for your COPD symptoms- let me know how this seems to work for you!   Remember lung cancer screening CT is due in May See me in about 6 months assuming all is well

## 2023-04-24 NOTE — Progress Notes (Unsigned)
[facility] [address] [place], [place] [address] [phone]  Date:  04/26/2023   Name:  Jeff Sheppard   DOB:  [birth year]   MRN:  [id number]  PCP:  Pearline Cables, MD    Chief Complaint: med follow up (Concerns/ questions: cough persists since last OV. /)   History of Present Illness:  Jeff Sheppard is a 67 y.o. very pleasant male patient who presents with the following:  Patient seen today for follow-up and medication refills- history of long-term tobacco use, prediabetes, COPD, chronic migraine, pacemaker placed for advanced AV block Most recent visit with myself was in April 2024-however I have seen him informally more recently when he came in with his wife [name] who is also my patient  He was seen most recently by cardiology in [facility] noted he was doing well, blood pressure well-controlled.  Pacemaker working normally  [employer] booster- he will do at pharmacy  Colon cancer screening-offered, he is not interested  Shingrix- this is too expensive, he will look into it but as of now they cannot afford it Flu shot- offered to pt, he declines  Lung cancer screening will be due in May Can update lab work today, most recent labs from April  Aspirin 81  Spiriva Ubrelvy as needed Topamax twice daily  He has noted a cough for the last several months- he otherwise feels well -no fever or other symptoms Cough is productive-he has diagnosis of chronic bronchitis He stopped using spiriva as it did not seem to be helping Have a history of tobacco use   Patient Active Problem List   Diagnosis Date Noted   Migraines 01/01/2022   Chicken pox 01/01/2022   Complete heart block (HCC) 10/20/2021   Prediabetes 07/14/2021   Tobacco abuse 07/14/2021   COPD (chronic obstructive pulmonary disease) (HCC) 07/14/2021    Past Medical History:  Diagnosis Date   Chicken pox    Migraines     Past Surgical  History:  Procedure Laterality Date   APPENDECTOMY     30 years ago   CATARACT EXTRACTION  2018   INNER EAR SURGERY     pt states he could not here   PACEMAKER IMPLANT N/A 10/21/2021   Procedure: PACEMAKER IMPLANT;  Surgeon: Marinus Maw, MD;  [facility];  Service: Cardiovascular;  Laterality: N/A;    Social History   Tobacco Use   Smoking status: Every Day    Current packs/day: 1.00    Average packs/day: 1 pack/day for 60.0 years (60.0 ttl pk-yrs)    Types: Cigarettes   Smokeless tobacco: Never  Vaping Use   Vaping status: Never Used  Substance Use Topics   Alcohol use: Never   Drug use: Never    Family History  Problem Relation Age of Onset   Cancer Mother    Breast cancer Mother    Early death Mother     No Known Allergies  Medication list has been reviewed and updated.  Current Outpatient Medications on File Prior to Visit  Medication Sig Dispense Refill   aspirin EC 81 MG tablet Take 1 tablet (81 mg total) by mouth daily. Swallow whole. 30 tablet 12   predniSONE (DELTASONE) 20 MG tablet Take 40 mg daily for 3 days, then 20 mg daily for 3 days 9 tablet 0   Ubrogepant (UBRELVY) 50 MG TABS TAKE 1 (50 MG TABLET) TABLET BY MOUTH ONCE  DAILY AS NEEDED FOR MIGRAINE HEADACHE. MAY  REPEAT  IN  ONE  HOUR.  **MAX  200  MG/24  HOURS** (Patient not taking: Reported on 04/26/2023) 30 tablet 0   No current facility-administered medications on file prior to visit.    Review of Systems:  As per HPI- otherwise negative.   Physical Examination: Vitals:   04/26/23 0907  BP: 136/78  Pulse: 65  Resp: 18  Temp: 98.1 F (36.7 C)  SpO2: 97%   Vitals:   04/26/23 0907  Weight: 173 lb 6.4 oz (78.7 kg)  Height: 5\' 7"  (1.702 m)   Body mass index is 27.16 kg/m. Ideal Body Weight: Weight in (lb) to have BMI = 25: 159.3  GEN: no acute distress.  Looks well, mild central overweight HEENT: Atraumatic, Normocephalic.  Bilateral TM wnl, oropharynx normal.   PEERL,EOMI.   Ears and Nose: No external deformity. CV: RRR, No M/G/R. No JVD. No thrill. No extra heart sounds. PULM: CTA B, no wheezes, crackles, rhonchi. No retractions. No resp. distress. No accessory muscle use. ABD: S, NT, ND. No rebound. No HSM. EXTR: No c/c/e PSYCH: Normally interactive. Conversant.    Assessment and Plan: Prediabetes - Plan: Comprehensive metabolic panel, Hemoglobin A1c  Screening for deficiency anemia - Plan: CBC  Migraine with aura and without status migrainosus, not intractable - Plan: topiramate (TOPAMAX) 25 MG tablet  Screening for prostate cancer - Plan: PSA  Screening, lipid - Plan: Lipid panel  Chronic bronchitis, unspecified chronic bronchitis type (HCC) - Plan: Fluticasone-Umeclidin-Vilant ([name] ELLIPTA) 100-62.5-25 MCG/ACT AEPB  Patient seen today for follow-up.  Labs are pending as above.  Will assess status of prediabetes He notes Topamax is working well for migraine prevention, refilled Follow-up on lipids, PSA He is still having symptoms of chronic bronchitis type COPD.  Plain Spiriva was not helpful.  I gave him a sample of [name], he will let me know how this works for him  Signed Abbe Amsterdam, MD  Received labs as below, message to patient I am concerned about anemia especially as he has never been screened for colon cancer, PSA has come up  Lab Results  Component Value Date   PSA 4.21 (H) 04/26/2023   PSA 3.47 08/12/2022   PSA 2.94 07/14/2021    Requested a lab visit only in 1 month to recheck CBC and PSA Results for orders placed or performed in visit on 04/26/23  CBC   Collection Time: 04/26/23  9:38 AM  Result Value Ref Range   WBC 6.6 4.0 - 10.5 K/uL   RBC 4.41 4.22 - 5.81 Mil/uL   Platelets 245.0 150.0 - 400.0 K/uL   Hemoglobin 12.6 (L) 13.0 - 17.0 g/dL   HCT 16.1 (L) 09.6 - 04.5 %   MCV 86.6 78.0 - 100.0 fl   MCHC 33.0 30.0 - 36.0 g/dL   RDW 40.9 81.1 - 91.4 %  Comprehensive metabolic panel   Collection  Time: 04/26/23  9:38 AM  Result Value Ref Range   Sodium 141 135 - 145 mEq/L   Potassium 3.9 3.5 - 5.1 mEq/L   Chloride 107 96 - 112 mEq/L   CO2 25 19 - 32 mEq/L   Glucose, Bld 120 (H) 70 - 99 mg/dL   BUN 13 6 - 23 mg/dL   Creatinine, Ser 7.82 0.40 - 1.50 mg/dL   Total Bilirubin 0.4 0.2 - 1.2 mg/dL   Alkaline Phosphatase 42 39 - 117 U/L   AST 20 0 - 37 U/L  ALT 18 0 - 53 U/L   Total Protein 7.0 6.0 - 8.3 g/dL   Albumin 4.4 3.5 - 5.2 g/dL   GFR 47.82 >95.62 mL/min   Calcium 9.3 8.4 - 10.5 mg/dL  Hemoglobin Z3Y   Collection Time: 04/26/23  9:38 AM  Result Value Ref Range   Hgb A1c MFr Bld 5.9 4.6 - 6.5 %  Lipid panel   Collection Time: 04/26/23  9:38 AM  Result Value Ref Range   Cholesterol 146 0 - 200 mg/dL   Triglycerides 86.5 0.0 - 149.0 mg/dL   HDL 78.46 >96.29 mg/dL   VLDL 52.8 0.0 - 41.3 mg/dL   LDL Cholesterol 85 0 - 99 mg/dL   Total CHOL/HDL Ratio 3    NonHDL 98.73   PSA   Collection Time: 04/26/23  9:38 AM  Result Value Ref Range   PSA 4.21 (H) 0.10 - 4.00 ng/mL

## 2023-04-26 ENCOUNTER — Ambulatory Visit (INDEPENDENT_AMBULATORY_CARE_PROVIDER_SITE_OTHER): Payer: PPO | Admitting: Family Medicine

## 2023-04-26 ENCOUNTER — Other Ambulatory Visit (HOSPITAL_BASED_OUTPATIENT_CLINIC_OR_DEPARTMENT_OTHER): Payer: Self-pay

## 2023-04-26 ENCOUNTER — Encounter: Payer: Self-pay | Admitting: Family Medicine

## 2023-04-26 ENCOUNTER — Ambulatory Visit: Payer: PPO | Admitting: Family Medicine

## 2023-04-26 VITALS — BP 136/78 | HR 65 | Temp 98.1°F | Resp 18 | Ht 67.0 in | Wt 173.4 lb

## 2023-04-26 DIAGNOSIS — J42 Unspecified chronic bronchitis: Secondary | ICD-10-CM

## 2023-04-26 DIAGNOSIS — Z130 Encounter for screening for diseases of the blood and blood-forming organs and certain disorders involving the immune mechanism: Secondary | ICD-10-CM

## 2023-04-26 DIAGNOSIS — R7303 Prediabetes: Secondary | ICD-10-CM

## 2023-04-26 DIAGNOSIS — R972 Elevated prostate specific antigen [PSA]: Secondary | ICD-10-CM

## 2023-04-26 DIAGNOSIS — D649 Anemia, unspecified: Secondary | ICD-10-CM | POA: Diagnosis not present

## 2023-04-26 DIAGNOSIS — Z13220 Encounter for screening for lipoid disorders: Secondary | ICD-10-CM | POA: Diagnosis not present

## 2023-04-26 DIAGNOSIS — G43109 Migraine with aura, not intractable, without status migrainosus: Secondary | ICD-10-CM

## 2023-04-26 DIAGNOSIS — Z125 Encounter for screening for malignant neoplasm of prostate: Secondary | ICD-10-CM

## 2023-04-26 LAB — COMPREHENSIVE METABOLIC PANEL
ALT: 18 U/L (ref 0–53)
AST: 20 U/L (ref 0–37)
Albumin: 4.4 g/dL (ref 3.5–5.2)
Alkaline Phosphatase: 42 U/L (ref 39–117)
BUN: 13 mg/dL (ref 6–23)
CO2: 25 meq/L (ref 19–32)
Calcium: 9.3 mg/dL (ref 8.4–10.5)
Chloride: 107 meq/L (ref 96–112)
Creatinine, Ser: 1.13 mg/dL (ref 0.40–1.50)
GFR: 66.97 mL/min (ref >60.00)
Glucose, Bld: 120 mg/dL — ABNORMAL HIGH (ref 70–99)
Potassium: 3.9 meq/L (ref 3.5–5.1)
Sodium: 141 meq/L (ref 135–145)
Total Bilirubin: 0.4 mg/dL (ref 0.2–1.2)
Total Protein: 7 g/dL (ref 6.0–8.3)

## 2023-04-26 LAB — LIPID PANEL
Cholesterol: 146 mg/dL (ref 0–200)
HDL: 46.8 mg/dL (ref >39.00)
LDL Cholesterol: 85 mg/dL (ref 0–99)
NonHDL: 98.73
Total CHOL/HDL Ratio: 3
Triglycerides: 68 mg/dL (ref 0.0–149.0)
VLDL: 13.6 mg/dL (ref 0.0–40.0)

## 2023-04-26 LAB — CBC
HCT: 38.2 % — ABNORMAL LOW (ref 39.0–52.0)
Hemoglobin: 12.6 g/dL — ABNORMAL LOW (ref 13.0–17.0)
MCHC: 33 g/dL (ref 30.0–36.0)
MCV: 86.6 fL (ref 78.0–100.0)
Platelets: 245 10*3/uL (ref 150.0–400.0)
RBC: 4.41 Mil/uL (ref 4.22–5.81)
RDW: 13.4 % (ref 11.5–15.5)
WBC: 6.6 10*3/uL (ref 4.0–10.5)

## 2023-04-26 LAB — HEMOGLOBIN A1C: Hgb A1c MFr Bld: 5.9 % (ref 4.6–6.5)

## 2023-04-26 LAB — PSA: PSA: 4.21 ng/mL — ABNORMAL HIGH (ref 0.10–4.00)

## 2023-04-26 MED ORDER — TRELEGY ELLIPTA 100-62.5-25 MCG/ACT IN AEPB: 1.0000 | INHALATION_SPRAY | Freq: Every day | RESPIRATORY_TRACT | Status: DC

## 2023-04-26 MED ORDER — TOPIRAMATE 25 MG PO TABS: 25.0000 mg | ORAL_TABLET | Freq: Two times a day (BID) | ORAL | 3 refills | Status: DC

## 2023-04-26 MED ORDER — COMIRNATY 30 MCG/0.3ML IM SUSY
0.3000 mL | PREFILLED_SYRINGE | Freq: Once | INTRAMUSCULAR | 0 refills | Status: AC
  Filled 2023-04-26: qty 0.3, 1d supply, fill #0

## 2023-05-03 ENCOUNTER — Ambulatory Visit (INDEPENDENT_AMBULATORY_CARE_PROVIDER_SITE_OTHER): Payer: PPO

## 2023-05-03 DIAGNOSIS — I442 Atrioventricular block, complete: Secondary | ICD-10-CM

## 2023-05-05 ENCOUNTER — Encounter: Payer: Self-pay | Admitting: Family Medicine

## 2023-05-05 DIAGNOSIS — J42 Unspecified chronic bronchitis: Secondary | ICD-10-CM

## 2023-05-05 LAB — CUP PACEART REMOTE DEVICE CHECK
Battery Remaining Longevity: 162 mo
Battery Remaining Percentage: 100 %
Brady Statistic RA Percent Paced: 34 %
Brady Statistic RV Percent Paced: 100 %
Date Time Interrogation Session: 20250106033100
Implantable Lead Connection Status: 753985
Implantable Lead Connection Status: 753985
Implantable Lead Implant Date: 20230627
Implantable Lead Implant Date: 20230627
Implantable Lead Location: 753859
Implantable Lead Location: 753860
Implantable Lead Model: 3830
Implantable Lead Model: 7841
Implantable Lead Serial Number: 1293503
Implantable Pulse Generator Implant Date: 20230627
Lead Channel Impedance Value: 619 Ohm
Lead Channel Impedance Value: 757 Ohm
Lead Channel Pacing Threshold Amplitude: 0.8 V
Lead Channel Pacing Threshold Pulse Width: 0.4 ms
Lead Channel Setting Pacing Amplitude: 2 V
Lead Channel Setting Pacing Amplitude: 2.5 V
Lead Channel Setting Pacing Pulse Width: 0.4 ms
Lead Channel Setting Sensing Sensitivity: 2.5 mV
Pulse Gen Serial Number: 585966
Zone Setting Status: 755011

## 2023-05-06 MED ORDER — TRELEGY ELLIPTA 100-62.5-25 MCG/ACT IN AEPB: 1.0000 | INHALATION_SPRAY | Freq: Every day | RESPIRATORY_TRACT | 11 refills | Status: DC

## 2023-05-10 ENCOUNTER — Telehealth: Payer: Self-pay

## 2023-05-10 ENCOUNTER — Telehealth: Payer: Self-pay | Admitting: Pharmacist

## 2023-05-10 NOTE — Telephone Encounter (Signed)
 Copied from CRM [id number]. Topic: Clinical - Medication Question >> May 10, 2023 10:22 AM Jeff Sheppard GRADE wrote: Reason for CRM: Patient is calling to inform Dr.Copland that Fluticasone-Umeclidin-Vilant ([name] ELLIPTA ) 100-62.5-25 MCG/ACT AEPB is expensive going through the pharmacy.

## 2023-05-10 NOTE — Telephone Encounter (Signed)
 Patient and his wife called because cost of [name] was high - $47 per month. They are asking if there is a less expensive alternative. Will check his insruance formulary and possible medication assistance program. Jeff Sheppard noted that [name] has worked well but previous inhaler did not.

## 2023-05-10 NOTE — Telephone Encounter (Signed)
 Pt aware and voices understanding.

## 2023-05-15 NOTE — Progress Notes (Deleted)
[facility] [address] [place], [place] [address] [phone]  Date:  05/17/2023   Name:  Jeff Sheppard   DOB:  31-Oct-1955   MRN:  [id number]  PCP:  Pearline Cables, MD    Chief Complaint: No chief complaint on file.   History of Present Illness:  Jeff Sheppard is a 68 y.o. very pleasant male patient who presents with the following:  Patient seen today for a follow-up visit- history of long-term tobacco use, prediabetes, COPD, chronic migraine, pacemaker placed for advanced AV block  Most recent visit with myself was at the end of December  At her last visit I gave him a [name] sample for COPD which was helpful, but was not affordable for him to refill Plain Spiriva had not been helpful previously.  I asked him to please contact his insurance company and look into alternatives and they planned to do so  With our last labs I asked him to come in for repeat PSA and CBC in a few weeks-we can certainly do this today  Patient Active Problem List   Diagnosis Date Noted   Migraines 01/01/2022   Chicken pox 01/01/2022   Complete heart block (HCC) 10/20/2021   Prediabetes 07/14/2021   Tobacco abuse 07/14/2021   COPD (chronic obstructive pulmonary disease) (HCC) 07/14/2021    Past Medical History:  Diagnosis Date   Chicken pox    Migraines     Past Surgical History:  Procedure Laterality Date   APPENDECTOMY     30 years ago   CATARACT EXTRACTION  2018   INNER EAR SURGERY     pt states he could not here   PACEMAKER IMPLANT N/A 10/21/2021   Procedure: PACEMAKER IMPLANT;  Surgeon: Marinus Maw, MD;  [facility]: [facility];  Service: Cardiovascular;  Laterality: N/A;    Social History   Tobacco Use   Smoking status: Every Day    Current packs/day: 1.00    Average packs/day: 1 pack/day for 60.0 years (60.0 ttl pk-yrs)    Types: Cigarettes   Smokeless tobacco: Never  Vaping Use   Vaping  status: Never Used  Substance Use Topics   Alcohol use: Never   Drug use: Never    Family History  Problem Relation Age of Onset   Cancer Mother    Breast cancer Mother    Early death Mother     No Known Allergies  Medication list has been reviewed and updated.  Current Outpatient Medications on File Prior to Visit  Medication Sig Dispense Refill   aspirin EC 81 MG tablet Take 1 tablet (81 mg total) by mouth daily. Swallow whole. 30 tablet 12   Fluticasone-Umeclidin-Vilant ([name] ELLIPTA) 100-62.5-25 MCG/ACT AEPB Inhale 1 puff into the lungs daily. 1 each 11   topiramate (TOPAMAX) 25 MG tablet Take 1 tablet (25 mg total) by mouth 2 (two) times daily. 180 tablet 3   Ubrogepant (UBRELVY) 50 MG TABS TAKE 1 (50 MG TABLET) TABLET BY MOUTH ONCE DAILY AS NEEDED FOR MIGRAINE HEADACHE. MAY  REPEAT  IN  ONE  HOUR.  **MAX  200  MG/24  HOURS** (Patient not taking: Reported on 04/26/2023) 30 tablet 0   No current facility-administered medications on file prior to visit.    Review of Systems:  As per HPI- otherwise negative.   Physical Examination: There were no vitals filed for this visit. There were no vitals filed for  this visit. There is no height or weight on file to calculate BMI. Ideal Body Weight:    GEN: no acute distress. HEENT: Atraumatic, Normocephalic.  Ears and Nose: No external deformity. CV: RRR, No M/G/R. No JVD. No thrill. No extra heart sounds. PULM: CTA B, no wheezes, crackles, rhonchi. No retractions. No resp. distress. No accessory muscle use. ABD: S, NT, ND, +BS. No rebound. No HSM. EXTR: No c/c/e PSYCH: Normally interactive. Conversant.    Assessment and Plan: ***  Signed Abbe Amsterdam, MD

## 2023-05-17 ENCOUNTER — Ambulatory Visit: Payer: PPO | Admitting: Family Medicine

## 2023-05-17 DIAGNOSIS — R7303 Prediabetes: Secondary | ICD-10-CM

## 2023-05-17 DIAGNOSIS — Z130 Encounter for screening for diseases of the blood and blood-forming organs and certain disorders involving the immune mechanism: Secondary | ICD-10-CM

## 2023-05-17 DIAGNOSIS — Z125 Encounter for screening for malignant neoplasm of prostate: Secondary | ICD-10-CM

## 2023-05-21 NOTE — Telephone Encounter (Signed)
Both [name] and Jeff Sheppard are tier 3 and would cost $47 per month for 2025. (No coverage gap for 2025).  If patient is unable to afford $47 copay, he might be eligible for patient assistance program We can apply for [facility] patient assistance program right away.  The application for Trelelgy requires patient pays $600 out of pocket before he would be eligible for program.   Will forward to PCP - patient will see Dr Patsy Lager 05/24/2023

## 2023-05-24 ENCOUNTER — Other Ambulatory Visit (INDEPENDENT_AMBULATORY_CARE_PROVIDER_SITE_OTHER): Payer: PPO

## 2023-05-24 ENCOUNTER — Ambulatory Visit: Payer: PPO | Admitting: Family Medicine

## 2023-05-24 DIAGNOSIS — R972 Elevated prostate specific antigen [PSA]: Secondary | ICD-10-CM

## 2023-05-24 DIAGNOSIS — D649 Anemia, unspecified: Secondary | ICD-10-CM

## 2023-05-24 LAB — CBC
HCT: 41.7 % (ref 39.0–52.0)
Hemoglobin: 13.5 g/dL (ref 13.0–17.0)
MCHC: 32.3 g/dL (ref 30.0–36.0)
MCV: 83.8 fL (ref 78.0–100.0)
Platelets: 240 10*3/uL (ref 150.0–400.0)
RBC: 4.98 Mil/uL (ref 4.22–5.81)
RDW: 13.8 % (ref 11.5–15.5)
WBC: 6.5 10*3/uL (ref 4.0–10.5)

## 2023-05-24 LAB — PSA: PSA: 4.24 ng/mL — ABNORMAL HIGH (ref 0.10–4.00)

## 2023-05-24 LAB — FERRITIN: Ferritin: 4.7 ng/mL — ABNORMAL LOW (ref 22.0–322.0)

## 2023-05-26 ENCOUNTER — Encounter: Payer: Self-pay | Admitting: Family Medicine

## 2023-05-26 ENCOUNTER — Other Ambulatory Visit: Payer: Self-pay | Admitting: Family Medicine

## 2023-05-26 DIAGNOSIS — R972 Elevated prostate specific antigen [PSA]: Secondary | ICD-10-CM

## 2023-05-27 ENCOUNTER — Encounter: Payer: Self-pay | Admitting: Pharmacist

## 2023-05-27 NOTE — Progress Notes (Signed)
   05/27/2023 Name: Jeff Sheppard MRN: [id number] DOB: 1955-06-17  Chief Complaint  Patient presents with   Medication Management    Jeff Sheppard is a 68 y.o. year old male who presented for a telephone visit.   Jeff Sheppard and his wife reached out to Clinical Pharmacist Practitioner regarding cost of [name]. They feel that $47 per month is more than they are able to afford at this time. They have requested assistance in managing medication access.    Subjective: Patient was started on [name] by Dr Jeff Sheppard 05/06/2023. He was provided samples but when he checked about cost at his pharmacy the monthly copay was $47 which he states is more than he is able to afford currently.  Reviewed his formulary. There are not any triple therapy options that have a lower copay than $47. He might qualify for receive Jeff Sheppard which is similar to [name] from [name] and Me medication assistance program.   Patient reports affordability concerns with their medications: Yes  Patient reports access/transportation concerns to their pharmacy: No  Patient reports adherence concerns with their medications:  No       Medications Reviewed Today     Reviewed by Jeff Sheppard, RPH-CPP (Pharmacist) on 05/27/23 at 0857  Med List Status: <None>   Medication Order Taking? Sig Documenting Provider Last Dose Status Informant  aspirin EC 81 MG tablet [id number] No Take 1 tablet (81 mg total) by mouth daily. Swallow whole. Jeff Freer, PA-C Taking Active   Fluticasone-Umeclidin-Vilant ([name] ELLIPTA) 100-62.5-25 MCG/ACT AEPB [id number]  Inhale 1 puff into the lungs daily. Sheppard, Jeff Found, MD  Active   topiramate (TOPAMAX) 25 MG tablet [id number]  Take 1 tablet (25 mg total) by mouth 2 (two) times daily. Sheppard, Jeff Found, MD  Active   Ubrogepant (UBRELVY) 50 MG TABS [id number] No TAKE 1 (50 MG TABLET) TABLET BY MOUTH ONCE DAILY AS NEEDED FOR MIGRAINE HEADACHE. MAY  REPEAT  IN  ONE  HOUR.  **MAX  200  MG/24   HOURS**  Patient not taking: Reported on 04/26/2023   Sheppard, Jeff Found, MD Not Taking Active               Assessment/Plan:   Medication Management / Access - Consulted with Dr Jeff Sheppard. She approved change to [facility].  - Started process for applying for medication assistance program [name]. Will need patinet's Medicare number and have him sign application, then we can fax to program.    Jeff Sheppard, PharmD Clinical Pharmacist [facility]  Population Health [phone]

## 2023-06-10 ENCOUNTER — Ambulatory Visit: Payer: PPO | Admitting: Urology

## 2023-06-10 ENCOUNTER — Encounter: Payer: Self-pay | Admitting: Urology

## 2023-06-10 VITALS — BP 123/85 | HR 76 | Ht 67.0 in | Wt 163.0 lb

## 2023-06-10 DIAGNOSIS — R972 Elevated prostate specific antigen [PSA]: Secondary | ICD-10-CM

## 2023-06-10 LAB — MICROSCOPIC EXAMINATION

## 2023-06-10 LAB — URINALYSIS, ROUTINE W REFLEX MICROSCOPIC
Bilirubin, UA: NEGATIVE
Glucose, UA: NEGATIVE
Ketones, UA: NEGATIVE
Leukocytes,UA: NEGATIVE
Nitrite, UA: NEGATIVE
RBC, UA: NEGATIVE
Specific Gravity, UA: >1.03 — ABNORMAL HIGH (ref 1.005–1.030)
Urobilinogen, Ur: 0.2 mg/dL (ref 0.2–1.0)
pH, UA: 5.5 (ref 5.0–7.5)

## 2023-06-10 NOTE — Progress Notes (Signed)
Assessment: 1. Elevated PSA     Plan: I personally reviewed the patient's chart including provider notes, and lab results. Today I had a long discussion with the patient regarding PSA and the rationale and controversies of prostate cancer early detection.  I discussed the pros and cons of further evaluation including TRUS and prostate Bx.  Potential adverse events and complications as well as standard instructions were given.  Patient expressed his understanding of these issues. I also discussed further evaluation with additional blood work, urine biomarker testing, and MRI of the prostate.  He is not a candidate for MRI due to his pacemaker and prior ear surgery. Schedule for prostate ultrasound and biopsy.   Chief Complaint:  Chief Complaint  Patient presents with   Elevated PSA    History of Present Illness:  Jeff Sheppard is a 68 y.o. male who is seen in consultation from Copland, Gwenlyn Found, MD for evaluation of elevated PSA.  PSA results: 3/23 2.94 4/24 2.47 12/24 4.21 1/25 4.24  No history of UTIs or prostatitis.  No family history of prostate cancer. He does not have any significant lower urinary tract symptoms.  He reports occasional frequency and urgency.  No dysuria or gross hematuria. IPSS = 3.   Past Medical History:  Past Medical History:  Diagnosis Date   Chicken pox    Migraines     Past Surgical History:  Past Surgical History:  Procedure Laterality Date   APPENDECTOMY     30 years ago   CATARACT EXTRACTION  2018   INNER EAR SURGERY     pt states he could not here   PACEMAKER IMPLANT N/A 10/21/2021   Procedure: PACEMAKER IMPLANT;  Surgeon: Marinus Maw, MD;  [facility];  Service: Cardiovascular;  Laterality: N/A;    Allergies:  No Known Allergies  Family History:  Family History  Problem Relation Age of Onset   Cancer Mother    Breast cancer Mother    Early death Mother     Social History:  Social History    Tobacco Use   Smoking status: Every Day    Current packs/day: 1.00    Average packs/day: 1 pack/day for 60.0 years (60.0 ttl pk-yrs)    Types: Cigarettes   Smokeless tobacco: Never  Vaping Use   Vaping status: Never Used  Substance Use Topics   Alcohol use: Never   Drug use: Never    Review of symptoms:  Constitutional:  Negative for unexplained weight loss, night sweats, fever, chills ENT:  Negative for nose bleeds, sinus pain, painful swallowing CV:  Negative for chest pain, shortness of breath, exercise intolerance, palpitations, loss of consciousness Resp:  Negative for cough, wheezing, shortness of breath GI:  Negative for nausea, vomiting, diarrhea, bloody stools GU:  Positives noted in HPI; otherwise negative for gross hematuria, dysuria, urinary incontinence Neuro:  Negative for seizures, poor balance, limb weakness, slurred speech Psych:  Negative for lack of energy, depression, anxiety Endocrine:  Negative for polydipsia, polyuria, symptoms of hypoglycemia (dizziness, hunger, sweating) Hematologic:  Negative for anemia, purpura, petechia, prolonged or excessive bleeding, use of anticoagulants  Allergic:  Negative for difficulty breathing or choking as a result of exposure to anything; no shellfish allergy; no allergic response (rash/itch) to materials, foods  Physical exam: BP 123/85   Pulse 76   Ht 5\' 7"  (1.702 m)   Wt 163 lb (73.9 kg)   BMI 25.53 kg/m  GENERAL APPEARANCE:  Well appearing, well  developed, well nourished, NAD HEENT: Atraumatic, Normocephalic, oropharynx clear. NECK: Supple without lymphadenopathy or thyromegaly. LUNGS: Clear to auscultation bilaterally. HEART: Regular Rate and Rhythm without murmurs, gallops, or rubs. ABDOMEN: Soft, non-tender, No Masses. EXTREMITIES: Moves all extremities well.  Without clubbing, cyanosis, or edema. NEUROLOGIC:  Alert and oriented x 3, normal gait, CN II-XII grossly intact.  MENTAL STATUS:  Appropriate. BACK:   Non-tender to palpation.  No CVAT SKIN:  Warm, dry and intact.   GU: Penis:  circumcised Meatus: Normal Scrotum: normal, no masses Testis: normal without masses bilateral Prostate: 40 g, NT, no nodules Rectum: Normal tone,  no masses or tenderness   Results: U/A:  0-5 WBC

## 2023-06-14 NOTE — Progress Notes (Signed)
 Remote pacemaker transmission.

## 2023-06-14 NOTE — Addendum Note (Signed)
Addended by: Geralyn Flash D on: 06/14/2023 04:46 PM   Modules accepted: Orders

## 2023-06-30 ENCOUNTER — Encounter: Payer: Self-pay | Admitting: Urology

## 2023-06-30 ENCOUNTER — Other Ambulatory Visit (HOSPITAL_BASED_OUTPATIENT_CLINIC_OR_DEPARTMENT_OTHER): Payer: Self-pay | Admitting: Urology

## 2023-06-30 ENCOUNTER — Ambulatory Visit (HOSPITAL_BASED_OUTPATIENT_CLINIC_OR_DEPARTMENT_OTHER)
Admission: RE | Admit: 2023-06-30 | Discharge: 2023-06-30 | Disposition: A | Discharge status: Home / Self Care | Payer: PPO | Source: Ambulatory Visit | Attending: Urology | Admitting: Urology

## 2023-06-30 ENCOUNTER — Ambulatory Visit: Payer: PPO | Admitting: Urology

## 2023-06-30 VITALS — BP 134/73 | HR 65 | Ht 67.0 in | Wt 163.0 lb

## 2023-06-30 DIAGNOSIS — Z2989 Encounter for other specified prophylactic measures: Secondary | ICD-10-CM

## 2023-06-30 DIAGNOSIS — N4231 Prostatic intraepithelial neoplasia: Secondary | ICD-10-CM

## 2023-06-30 DIAGNOSIS — C61 Malignant neoplasm of prostate: Secondary | ICD-10-CM | POA: Insufficient documentation

## 2023-06-30 DIAGNOSIS — N4232 Atypical small acinar proliferation of prostate: Secondary | ICD-10-CM

## 2023-06-30 DIAGNOSIS — R972 Elevated prostate specific antigen [PSA]: Secondary | ICD-10-CM

## 2023-06-30 LAB — URINALYSIS, ROUTINE W REFLEX MICROSCOPIC
Bilirubin, UA: NEGATIVE
Glucose, UA: NEGATIVE
Ketones, UA: NEGATIVE
Leukocytes,UA: NEGATIVE
Nitrite, UA: NEGATIVE
RBC, UA: NEGATIVE
Specific Gravity, UA: 1.025 (ref 1.005–1.030)
Urobilinogen, Ur: 0.2 mg/dL (ref 0.2–1.0)
pH, UA: 6 (ref 5.0–7.5)

## 2023-06-30 LAB — MICROSCOPIC EXAMINATION

## 2023-06-30 MED ORDER — CEFTRIAXONE SODIUM 1 G IJ SOLR
1.0000 g | Freq: Once | INTRAMUSCULAR | Status: AC
  Administered 2023-06-30: 1 g via INTRAMUSCULAR

## 2023-06-30 NOTE — Progress Notes (Signed)
 IM Injection  Patient is present today for an IM Injection for treatment of infection prevention post prostate biopsy Drug: Ceftriaxone Dose:1g Location:Right upper outer buttocks Lot: [id number] Exp:02/2025 Patient tolerated well, no complications were noted  Performed by: Arville Go CMA

## 2023-06-30 NOTE — Progress Notes (Signed)
 Assessment: 1. Elevated PSA     Plan: Postbiopsy instructions given. Return to office in 7-10 days to discuss biopsy results.  Chief Complaint:  Chief Complaint  Patient presents with   Prostate Biopsy    History of Present Illness:  Jeff Sheppard is a 68 y.o. male who is seen for evaluation of elevated PSA.  PSA results: 3/23 2.94 4/24 2.47 12/24 4.21 1/25 4.24  No history of UTIs or prostatitis.  No family history of prostate cancer. He does not have any significant lower urinary tract symptoms.  He reports occasional frequency and urgency.  No dysuria or gross hematuria. IPSS = 3.  He presents today for further evaluation with prostate biopsy.  Portions of the above documentation were copied from a prior visit for review purposes only.  Past Medical History:  Past Medical History:  Diagnosis Date   Chicken pox    Migraines     Past Surgical History:  Past Surgical History:  Procedure Laterality Date   APPENDECTOMY     30 years ago   CATARACT EXTRACTION  2018   INNER EAR SURGERY     pt states he could not here   PACEMAKER IMPLANT N/A 10/21/2021   Procedure: PACEMAKER IMPLANT;  Surgeon: Marinus Maw, MD;  [facility]: [facility];  Service: Cardiovascular;  Laterality: N/A;    Allergies:  No Known Allergies  Family History:  Family History  Problem Relation Age of Onset   Cancer Mother    Breast cancer Mother    Early death Mother     Social History:  Social History   Tobacco Use   Smoking status: Every Day    Current packs/day: 1.00    Average packs/day: 1 pack/day for 60.0 years (60.0 ttl pk-yrs)    Types: Cigarettes   Smokeless tobacco: Never  Vaping Use   Vaping status: Never Used  Substance Use Topics   Alcohol use: Never   Drug use: Never    ROS: Constitutional:  Negative for fever, chills, weight loss CV: Negative for chest pain, previous MI, hypertension Respiratory:  Negative for shortness of breath, wheezing,  sleep apnea, frequent cough GI:  Negative for nausea, vomiting, bloody stool, GERD  Physical exam: There were no vitals taken for this visit. GENERAL APPEARANCE:  Well appearing, well developed, well nourished, NAD HEENT:  Atraumatic, normocephalic, oropharynx clear NECK:  Supple without lymphadenopathy or thyromegaly ABDOMEN:  Soft, non-tender, no masses EXTREMITIES:  Moves all extremities well, without clubbing, cyanosis, or edema NEUROLOGIC:  Alert and oriented x 3, normal gait, CN II-XII grossly intact MENTAL STATUS:  appropriate BACK:  Non-tender to palpation, No CVAT SKIN:  Warm, dry, and intact   Results: U/A: 0-5 WBC, 0-2 RBC, few bacteria  TRANSRECTAL ULTRASOUND AND PROSTATE BIOPSY  Indication:  Elevated PSA  Prophylactic antibiotic administration: Rocephin  All medications that could result in increased bleeding were discontinued within an appropriate period of the time of biopsy.  Risk including bleeding and infection were discussed.  Informed consent was obtained.  The patient was placed in the left lateral decubitus position.  PROCEDURE 1.  TRANSRECTAL ULTRASOUND OF THE PROSTATE  The 7 MHz transrectal probe was used to image the prostate.  Anal stenosis was not noted.  TRUS volume: 43.1 ml  Hypoechoic areas: None  Hyperechoic areas: None  Central calcifications: present  Margins:  normal  Seminal Vesicles: normal   PROCEDURE 2:  PROSTATE BIOPSY  A periprostatic block was performed using 1% lidocaine  and transrectal ultrasound guidance. Under transrectal ultrasound guidance, and using the Biopty gun, prostate biopsies were obtained systematically from the apex, mid gland, and base bilaterally.  A total of 12 cores were obtained.  Hemostasis was obtained with gentle pressure on the prostate.  The procedures were well-tolerated.  No significant bleeding was noted at the end of the procedure.  The patient was stable for discharge from the office.

## 2023-07-05 ENCOUNTER — Encounter: Payer: Self-pay | Admitting: Urology

## 2023-07-06 ENCOUNTER — Encounter: Payer: Self-pay | Admitting: Urology

## 2023-07-06 ENCOUNTER — Ambulatory Visit: Admitting: Urology

## 2023-07-06 VITALS — BP 138/87 | HR 60 | Ht 66.0 in | Wt 163.0 lb

## 2023-07-06 DIAGNOSIS — C61 Malignant neoplasm of prostate: Secondary | ICD-10-CM | POA: Diagnosis not present

## 2023-07-06 LAB — MICROSCOPIC EXAMINATION

## 2023-07-06 LAB — URINALYSIS, ROUTINE W REFLEX MICROSCOPIC
Bilirubin, UA: NEGATIVE
Glucose, UA: NEGATIVE
Leukocytes,UA: NEGATIVE
Nitrite, UA: NEGATIVE
Specific Gravity, UA: >1.03 — ABNORMAL HIGH (ref 1.005–1.030)
Urobilinogen, Ur: 0.2 mg/dL (ref 0.2–1.0)
pH, UA: 5.5 (ref 5.0–7.5)

## 2023-07-06 NOTE — Progress Notes (Signed)
 Assessment: 1. Prostate cancer (HCC); PSA 4.24; GG 1; low risk     Plan: I spent a total of 30 minutes counseling Jeff Sheppard regarding the diagnosis of localized prostate cancer.  I spent the first 15 minutes discussing the biopsy results.  Using the prostate cancer nomogram, I cited a probability of 85 percent for localized prostate cancer, 15 percent of extracapsular extension, 1 percent of seminal vesicle involvement, and 1 percent of lymph node involvement.  I discussed the diagnosis of localized prostate cancer in the natural history of prostate cancer. I then spent the next 15 minutes discussing treatment options for localized prostate cancer.  Specifically, I discussed active surveillance, radical prostatectomy (RALP, RRP, RPP), external beam radiation, low dose rate brachytherapy, cryosurgery, and high intensity frequency ultrasound.  I discussed the risk and benefits of each treatment.  I discussed the potential risk of impotence and incontinence as they relate to treatment of prostate cancer.  Questions were answered.  The patient was given literature regarding prostate cancer to review.   Recommend further evaluation with genomic study to confirm that he is a good candidate for active surveillance. Decipher test ordered - will contact him with results.  Following our discussion, he is interested in proceeding with active surveillance.  I discussed the role of active surveillance for management of low risk and favorable intermediate risk prostate cancer. The advantages of active surveillance were discussed including the following:  - Somewhere between 50% and 68% of eligible patients may safely avoid treatment for at least 10 years - Patient's will avoid possible side effects of definitive therapy that may be unnecessary - Quality of life/normal activities will be less affected while on active surveillance - Risk of unnecessary treatment of small, indolent cancers will be  reduced  Limitations of active surveillance discussed including: -Between 32% and 50% of patients will require treatment by 10 years, although treatment delays do not seem to impact cure rate -Although the risk is very low (<0.5% according to most published series), it is possible for cancers progressed to a regional or metastatic stage while on active surveillance  I also discussed that patients who choose active surveillance should have regular follow-up including PSA testing approximately every 6 months, DRE annually, confirmatory biopsy approximately 1 year after initial diagnosis, multiparametric MRI every 12 months or as clinically indicated. He is not a candidate for MRI due to his pacemaker and cochlear implant.  Return to office in 3 months.  Chief Complaint:  Chief Complaint  Patient presents with   Results    History of Present Illness:  Jeff Sheppard is a 68 y.o. male who is seen for discussion of recent prostate biopsy showing low-grade prostate cancer. PSA results: 3/23 2.94 4/24 2.47 12/24 4.21 1/25 4.24  No history of UTIs or prostatitis.  No family history of prostate cancer. He does not have any significant lower urinary tract symptoms.  He reports occasional frequency and urgency.  No dysuria or gross hematuria. IPSS = 3.  He returns today following his transrectal ultrasound and biopsy of the prostate on 06/30/23. PSA: 4.24 ng/ml TRUS volume:  43.1 ml  PSA density:  0.09 Biopsy results:             Gleason score: 3+ 3 = 6; ASAP in 2 areas on left            # positive cores: 0/6 on right    1/6 on left  Location of cancer: apex  Complications after biopsy: none Patient without significant LUTS.   Patient without erectile dysfunction.    Portions of the above documentation were copied from a prior visit for review purposes only.  Past Medical History:  Past Medical History:  Diagnosis Date   Chicken pox    Migraines     Past Surgical  History:  Past Surgical History:  Procedure Laterality Date   APPENDECTOMY     30 years ago   CATARACT EXTRACTION  2018   INNER EAR SURGERY     pt states he could not here   PACEMAKER IMPLANT N/A 10/21/2021   Procedure: PACEMAKER IMPLANT;  Surgeon: Marinus Maw, MD;  Location: [facility];  Service: Cardiovascular;  Laterality: N/A;    Allergies:  No Known Allergies  Family History:  Family History  Problem Relation Age of Onset   Cancer Mother    Breast cancer Mother    Early death Mother     Social History:  Social History   Tobacco Use   Smoking status: Every Day    Current packs/day: 1.00    Average packs/day: 1 pack/day for 60.0 years (60.0 ttl pk-yrs)    Types: Cigarettes   Smokeless tobacco: Never  Vaping Use   Vaping status: Never Used  Substance Use Topics   Alcohol use: Never   Drug use: Never    ROS: Constitutional:  Negative for fever, chills, weight loss CV: Negative for chest pain, previous MI, hypertension Respiratory:  Negative for shortness of breath, wheezing, sleep apnea, frequent cough GI:  Negative for nausea, vomiting, bloody stool, GERD  Physical exam: BP 138/87   Pulse 60   Ht 5\' 6"  (1.676 m)   Wt 163 lb (73.9 kg)   BMI 26.31 kg/m  GENERAL APPEARANCE:  Well appearing, well developed, well nourished, NAD HEENT:  Atraumatic, normocephalic, oropharynx clear NECK:  Supple without lymphadenopathy or thyromegaly ABDOMEN:  Soft, non-tender, no masses EXTREMITIES:  Moves all extremities well, without clubbing, cyanosis, or edema NEUROLOGIC:  Alert and oriented x 3, normal gait, CN II-XII grossly intact MENTAL STATUS:  appropriate BACK:  Non-tender to palpation, No CVAT SKIN:  Warm, dry, and intact   Results: U/A: 0-5 WBC, >30 RBC

## 2023-07-23 DIAGNOSIS — C61 Malignant neoplasm of prostate: Secondary | ICD-10-CM | POA: Diagnosis not present

## 2023-07-28 ENCOUNTER — Encounter: Payer: Self-pay | Admitting: Urology

## 2023-08-02 ENCOUNTER — Telehealth: Payer: Self-pay | Admitting: Urology

## 2023-08-02 ENCOUNTER — Ambulatory Visit (INDEPENDENT_AMBULATORY_CARE_PROVIDER_SITE_OTHER): Payer: PPO

## 2023-08-02 DIAGNOSIS — I442 Atrioventricular block, complete: Secondary | ICD-10-CM | POA: Diagnosis not present

## 2023-08-02 NOTE — Telephone Encounter (Signed)
 I discussed the results of the Decipher test with the patient by phone today. Decipher showed INTERMEDIATE risk.  I discussed the potential for more aggressive tumor biology than expected based on his Gleason score of 6.  I reviewed the options for management including active surveillance and definitive treatment.  He would like to continue with active surveillance at this time. Unfortunately, a MRI is not an option due to his pacemaker and cochlear implant.

## 2023-08-03 LAB — CUP PACEART REMOTE DEVICE CHECK
Battery Remaining Longevity: 162 mo
Battery Remaining Percentage: 100 %
Brady Statistic RA Percent Paced: 31 %
Brady Statistic RV Percent Paced: 100 %
Date Time Interrogation Session: 20250407033100
Implantable Lead Connection Status: 753985
Implantable Lead Connection Status: 753985
Implantable Lead Implant Date: 20230627
Implantable Lead Implant Date: 20230627
Implantable Lead Location: 753859
Implantable Lead Location: 753860
Implantable Lead Model: 3830
Implantable Lead Model: 7841
Implantable Lead Serial Number: 1293503
Implantable Pulse Generator Implant Date: 20230627
Lead Channel Impedance Value: 545 Ohm
Lead Channel Impedance Value: 714 Ohm
Lead Channel Pacing Threshold Amplitude: 0.7 V
Lead Channel Pacing Threshold Amplitude: 1.4 V
Lead Channel Pacing Threshold Pulse Width: 0.4 ms
Lead Channel Pacing Threshold Pulse Width: 0.4 ms
Lead Channel Setting Pacing Amplitude: 2 V
Lead Channel Setting Pacing Amplitude: 2.5 V
Lead Channel Setting Pacing Pulse Width: 0.4 ms
Lead Channel Setting Sensing Sensitivity: 2.5 mV
Pulse Gen Serial Number: 585966
Zone Setting Status: 755011

## 2023-08-04 ENCOUNTER — Encounter: Payer: Self-pay | Admitting: Internal Medicine

## 2023-08-18 ENCOUNTER — Encounter: Payer: Self-pay | Admitting: Pharmacist

## 2023-08-18 NOTE — Progress Notes (Signed)
   08/18/2023 Name: Jeff Sheppard MRN: [id number] DOB: 06-12-1955  Chief Complaint  Patient presents with   Medication Management    Jeff Sheppard is a 68 y.o. year old male who presented for a telephone visit.   [name] and his wife reached out to Clinical Pharmacist Practitioner IN January 2025 regarding cost of [name]. At the time $47 per month was more than they were able to afford. They requested assistance in managing medication access.    Subjective: Patient was started on [name] by Dr Geralyn Knee 05/06/2023. He was provided samples but when he checked about cost at his pharmacy the monthly copay was $47 which he states is more than he is able to afford currently.  Reviewed his formulary. There are not any triple therapy options that have a lower copay than $47.  His PCP approved change to Breztri and we [name]. Patient was approved and received his first shipment of 3 inhalers in March 2025.  He reports today that he has been using Breztri for the last month and that he has helped with his shortness of breath / Breathing.    Patient reports affordability concerns with their medications: Yes  Patient reports access/transportation concerns to their pharmacy: No  Patient reports adherence concerns with their medications:  No       Medications Reviewed Today     Reviewed by Cecilie Coffee, RPH-CPP (Pharmacist) on 08/18/23 at 0934  Med List Status: <None>   Medication Order Taking? Sig Documenting Provider Last Dose Status Informant  aspirin  EC 81 MG tablet [id number] Yes Take 1 tablet (81 mg total) by mouth daily. Swallow whole. Tylene Galla, PA-C Taking Active   Budeson-Glycopyrrol-Formoterol (BREZTRI AEROSPHERE) 160-9-4.8 MCG/ACT Sudie Ely [id number] Yes Inhale 2 puffs into the lungs in the morning and at bedtime. [provider] Taking Active            Med Note Alida Ion, Hollis Oh B   Wed Aug 18, 2023  9:33 AM) Getting from [facility]  and Me Program thru 04/26/2024  topiramate  (TOPAMAX ) 25 MG tablet [id number] Yes Take 1 tablet (25 mg total) by mouth 2 (two) times daily. Copland, Skipper Dumas, MD Taking Active   Ubrogepant  (UBRELVY ) 50 MG TABS [id number] No TAKE 1 (50 MG TABLET) TABLET BY MOUTH ONCE DAILY AS NEEDED FOR MIGRAINE HEADACHE. MAY  REPEAT  IN  ONE  HOUR.  **MAX  200  MG/24  HOURS**  Patient not taking: Reported on 06/30/2023   Copland, Skipper Dumas, MD Not Taking Active               Assessment/Plan:   Medication Management / Access - Continue Breztri - 2 inhalations into lunch twice a day - Discussed smoking cessation - patient in contemplative stage.   - [place] program [name].   Follow up at the end of 2025 regarding reapplying for medication assistance program for Breztri.   Cecilie Coffee, PharmD Clinical Pharmacist [facility]  Population Health [phone]

## 2023-09-22 NOTE — Progress Notes (Signed)
 Remote pacemaker transmission.

## 2023-09-22 NOTE — Addendum Note (Signed)
 Addended by: Edra Govern D on: 09/22/2023 01:30 PM   Modules accepted: Orders

## 2023-10-06 ENCOUNTER — Ambulatory Visit: Admitting: Urology

## 2023-10-06 ENCOUNTER — Encounter: Payer: Self-pay | Admitting: Urology

## 2023-10-06 VITALS — BP 143/88 | HR 78 | Ht 67.0 in | Wt 158.0 lb

## 2023-10-06 DIAGNOSIS — C61 Malignant neoplasm of prostate: Secondary | ICD-10-CM

## 2023-10-06 LAB — URINALYSIS, ROUTINE W REFLEX MICROSCOPIC
Bilirubin, UA: NEGATIVE
Glucose, UA: NEGATIVE
Ketones, UA: NEGATIVE
Leukocytes,UA: NEGATIVE
Nitrite, UA: NEGATIVE
Protein,UA: NEGATIVE
RBC, UA: NEGATIVE
Specific Gravity, UA: <1.005 — ABNORMAL LOW (ref 1.005–1.030)
Urobilinogen, Ur: 0.2 mg/dL (ref 0.2–1.0)
pH, UA: 6 (ref 5.0–7.5)

## 2023-10-06 NOTE — Progress Notes (Signed)
 Assessment: 1. Prostate cancer (HCC); PSA 4.24; GG 1; low risk     Plan: I reviewed the results of the decipher test with the patient and his wife and daughter today. I discussed the findings of intermediate risk prostate cancer on the genomic test and how this relates to the biopsy results. Based on the patient's NCCN risk group and his decipher risk group, he would still fall into a low clinical-genomic risk group. He would like to continue with active surveillance.  I discussed the role of active surveillance for management of low risk and favorable intermediate risk prostate cancer. The advantages of active surveillance were discussed including the following:  - Somewhere between 50% and 68% of eligible patients may safely avoid treatment for at least 10 years - Patient's will avoid possible side effects of definitive therapy that may be unnecessary - Quality of life/normal activities will be less affected while on active surveillance - Risk of unnecessary treatment of small, indolent cancers will be reduced  Limitations of active surveillance discussed including: -Between 32% and 50% of patients will require treatment by 10 years, although treatment delays do not seem to impact cure rate -Although the risk is very low (<0.5% according to most published series), it is possible for cancers progressed to a regional or metastatic stage while on active surveillance  I also discussed that patients who choose active surveillance should have regular follow-up including PSA testing approximately every 6 months, DRE annually, confirmatory biopsy approximately 1 year after initial diagnosis, multiparametric MRI every 12 months or as clinically indicated. He is not a candidate for MRI due to his pacemaker and cochlear implant.  Return to office with pre-visit PSA in 3 months.  Chief Complaint:  Chief Complaint  Patient presents with   Prostate Cancer    History of Present  Illness:  Jeff Sheppard is a 68 y.o. male who is seen for continued evaluation of low-grade prostate cancer. PSA results: 3/23 2.94 4/24 2.47 12/24 4.21 1/25 4.24  No history of UTIs or prostatitis.  No family history of prostate cancer. He does not have any significant lower urinary tract symptoms.  He reports occasional frequency and urgency.  No dysuria or gross hematuria. IPSS = 3.  He underwent evaluation with  a transrectal ultrasound and biopsy of the prostate on 06/30/23. PSA: 4.24 ng/ml TRUS volume:  43.1 ml  PSA density:  0.09 Biopsy results:             Gleason score: 3+ 3 = 6; ASAP in 2 areas on left            # positive cores: 0/6 on right    1/6 on left            Location of cancer: apex  Complications after biopsy: none Decipher score was 0.55 indicating intermediate risk.  The potential for more aggressive tumor biology based on the decipher score was discussed with the patient.  Options for definitive management versus continued active surveillance discussed with the patient.  He elected to proceed with active surveillance.  He returns today for follow-up.  No new urinary symptoms.  No dysuria or gross hematuria.  No bone pain or weight loss. IPSS = 0/0.   Portions of the above documentation were copied from a prior visit for review purposes only.  Past Medical History:  Past Medical History:  Diagnosis Date   Chicken pox    Migraines     Past Surgical History:  Past  Surgical History:  Procedure Laterality Date   APPENDECTOMY     30 years ago   CATARACT EXTRACTION  2018   INNER EAR SURGERY     pt states he could not here   PACEMAKER IMPLANT N/A 10/21/2021   Procedure: PACEMAKER IMPLANT;  Surgeon: Tammie Fall, MD;  Location: [facility];  Service: Cardiovascular;  Laterality: N/A;    Allergies:  No Known Allergies  Family History:  Family History  Problem Relation Age of Onset   Cancer Mother    Breast cancer Mother    Early  death Mother     Social History:  Social History   Tobacco Use   Smoking status: Every Day    Current packs/day: 1.00    Average packs/day: 1 pack/day for 60.0 years (60.0 ttl pk-yrs)    Types: Cigarettes   Smokeless tobacco: Never  Vaping Use   Vaping status: Never Used  Substance Use Topics   Alcohol use: Never   Drug use: Never    ROS: Constitutional:  Negative for fever, chills, weight loss CV: Negative for chest pain, previous MI, hypertension Respiratory:  Negative for shortness of breath, wheezing, sleep apnea, frequent cough GI:  Negative for nausea, vomiting, bloody stool, GERD  Physical exam: BP (!) 143/88   Pulse 78   Ht 5' 7 (1.702 m)   Wt 158 lb (71.7 kg)   BMI 24.75 kg/m  GENERAL APPEARANCE:  Well appearing, well developed, well nourished, NAD HEENT:  Atraumatic, normocephalic, oropharynx clear NECK:  Supple without lymphadenopathy or thyromegaly ABDOMEN:  Soft, non-tender, no masses EXTREMITIES:  Moves all extremities well, without clubbing, cyanosis, or edema NEUROLOGIC:  Alert and oriented x 3, normal gait, CN II-XII grossly intact MENTAL STATUS:  appropriate BACK:  Non-tender to palpation, No CVAT SKIN:  Warm, dry, and intact   Results: U/A: negative

## 2023-11-01 ENCOUNTER — Ambulatory Visit: Payer: PPO

## 2023-11-01 DIAGNOSIS — I442 Atrioventricular block, complete: Secondary | ICD-10-CM | POA: Diagnosis not present

## 2023-11-02 LAB — CUP PACEART REMOTE DEVICE CHECK
Battery Remaining Longevity: 156 mo
Battery Remaining Percentage: 100 %
Brady Statistic RA Percent Paced: 32 %
Brady Statistic RV Percent Paced: 100 %
Date Time Interrogation Session: 20250707043700
Implantable Lead Connection Status: 753985
Implantable Lead Connection Status: 753985
Implantable Lead Implant Date: 20230627
Implantable Lead Implant Date: 20230627
Implantable Lead Location: 753859
Implantable Lead Location: 753860
Implantable Lead Model: 3830
Implantable Lead Model: 7841
Implantable Lead Serial Number: 1293503
Implantable Pulse Generator Implant Date: 20230627
Lead Channel Impedance Value: 648 Ohm
Lead Channel Impedance Value: 752 Ohm
Lead Channel Pacing Threshold Amplitude: 0.6 V
Lead Channel Pacing Threshold Amplitude: 0.8 V
Lead Channel Pacing Threshold Pulse Width: 0.4 ms
Lead Channel Pacing Threshold Pulse Width: 0.4 ms
Lead Channel Setting Pacing Amplitude: 2 V
Lead Channel Setting Pacing Amplitude: 2.5 V
Lead Channel Setting Pacing Pulse Width: 0.4 ms
Lead Channel Setting Sensing Sensitivity: 2.5 mV
Pulse Gen Serial Number: 585966
Zone Setting Status: 755011

## 2023-11-04 ENCOUNTER — Ambulatory Visit: Payer: Self-pay | Admitting: Internal Medicine

## 2023-12-30 ENCOUNTER — Other Ambulatory Visit

## 2024-01-06 ENCOUNTER — Ambulatory Visit: Admitting: Urology

## 2024-01-06 ENCOUNTER — Other Ambulatory Visit

## 2024-01-06 DIAGNOSIS — C61 Malignant neoplasm of prostate: Secondary | ICD-10-CM | POA: Diagnosis not present

## 2024-01-07 ENCOUNTER — Ambulatory Visit: Payer: Self-pay | Admitting: Urology

## 2024-01-07 LAB — PSA: Prostate Specific Ag, Serum: 4.9 ng/mL — ABNORMAL HIGH (ref 0.0–4.0)

## 2024-01-12 ENCOUNTER — Telehealth: Payer: Self-pay | Admitting: Pharmacist

## 2024-01-12 MED ORDER — BREZTRI AEROSPHERE 160-9-4.8 MCG/ACT IN AERO: 2.0000 | INHALATION_SPRAY | Freq: Two times a day (BID) | RESPIRATORY_TRACT | 3 refills | Status: DC

## 2024-01-12 NOTE — Telephone Encounter (Signed)
 Received fax requesting an updated Rx for Breztri  inhaler for [name].  Patient ID: [id number]  Rx was sent to [facility] which fills Rx's for [name]. Patient will be due to re-enroll in about 1 or 2 months.

## 2024-01-13 ENCOUNTER — Encounter: Payer: Self-pay | Admitting: Urology

## 2024-01-13 ENCOUNTER — Ambulatory Visit (INDEPENDENT_AMBULATORY_CARE_PROVIDER_SITE_OTHER): Admitting: Urology

## 2024-01-13 VITALS — BP 149/89 | HR 87 | Ht 67.0 in | Wt 159.0 lb

## 2024-01-13 DIAGNOSIS — C61 Malignant neoplasm of prostate: Secondary | ICD-10-CM | POA: Diagnosis not present

## 2024-01-13 LAB — URINALYSIS, ROUTINE W REFLEX MICROSCOPIC
Bilirubin, UA: NEGATIVE
Glucose, UA: NEGATIVE
Ketones, UA: NEGATIVE
Leukocytes,UA: NEGATIVE
Nitrite, UA: NEGATIVE
RBC, UA: NEGATIVE
Specific Gravity, UA: 1.02 (ref 1.005–1.030)
Urobilinogen, Ur: 0.2 mg/dL (ref 0.2–1.0)
pH, UA: 6 (ref 5.0–7.5)

## 2024-01-13 LAB — MICROSCOPIC EXAMINATION: Epithelial Cells (non renal): NONE SEEN /HPF (ref 0–10)

## 2024-01-13 NOTE — Progress Notes (Signed)
 Assessment: 1. Prostate cancer (HCC); PSA 4.24; GG 1; low risk; dx 3/25; on active surveillance     Plan: He would like to continue with active surveillance.  I reviewed the role of active surveillance for management of low risk and favorable intermediate risk prostate cancer.  I also reviewd that patients who choose active surveillance should have regular follow-up including PSA testing approximately every 6 months, DRE annually, confirmatory biopsy approximately 1 year after initial diagnosis, multiparametric MRI every 12 months or as clinically indicated. He is not a candidate for MRI due to his pacemaker and cochlear implant.  Return to office with pre-visit PSA in 6 months. Will arrange for confirmatory biopsy after his next visit.  Chief Complaint:  Chief Complaint  Patient presents with   Prostate Cancer    History of Present Illness:  Jeff Sheppard is a 68 y.o. male who is seen for continued evaluation of low-grade prostate cancer. PSA results: 3/23 2.94 4/24 2.47 12/24 4.21 1/25 4.24  No history of UTIs or prostatitis.  No family history of prostate cancer. He does not have any significant lower urinary tract symptoms.  He reports occasional frequency and urgency.  No dysuria or gross hematuria. IPSS = 3.  He underwent evaluation with a transrectal ultrasound and biopsy of the prostate on 06/30/23. PSA: 4.24 ng/ml TRUS volume:  43.1 ml  PSA density:  0.09 Biopsy results:             Gleason score: 3+ 3 = 6; ASAP in 2 areas on left            # positive cores: 0/6 on right    1/6 on left            Location of cancer: apex  Complications after biopsy: none Decipher score was 0.55 indicating intermediate risk.  The potential for more aggressive tumor biology based on the decipher score was discussed with the patient.  Options for definitive management versus continued active surveillance discussed with the patient.  He elected to proceed with active  surveillance.  PSA on surveillance: 9/25 4.9  He returns today for follow-up.  He continues to do very well.  No new lower urinary tract symptoms.  He has occasional intermittency.  No dysuria or gross hematuria. IPSS = 1/0.  No bone pain or weight loss.  Portions of the above documentation were copied from a prior visit for review purposes only.  Past Medical History:  Past Medical History:  Diagnosis Date   Chicken pox    Migraines     Past Surgical History:  Past Surgical History:  Procedure Laterality Date   APPENDECTOMY     30 years ago   CATARACT EXTRACTION  2018   INNER EAR SURGERY     pt states he could not here   PACEMAKER IMPLANT N/A 10/21/2021   Procedure: PACEMAKER IMPLANT;  Surgeon: Waddell Danelle ORN, MD;  Location: [facility];  Service: Cardiovascular;  Laterality: N/A;    Allergies:  No Known Allergies  Family History:  Family History  Problem Relation Age of Onset   Cancer Mother    Breast cancer Mother    Early death Mother     Social History:  Social History   Tobacco Use   Smoking status: Every Day    Current packs/day: 1.00    Average packs/day: 1 pack/day for 60.0 years (60.0 ttl pk-yrs)    Types: Cigarettes   Smokeless tobacco: Never  Vaping Use  Vaping status: Never Used  Substance Use Topics   Alcohol use: Never   Drug use: Never    ROS: Constitutional:  Negative for fever, chills, weight loss CV: Negative for chest pain, previous MI, hypertension Respiratory:  Negative for shortness of breath, wheezing, sleep apnea, frequent cough GI:  Negative for nausea, vomiting, bloody stool, GERD  Physical exam: BP (!) 149/89   Pulse 87   Ht 5' 7 (1.702 m)   Wt 159 lb (72.1 kg)   BMI 24.90 kg/m  GENERAL APPEARANCE:  Well appearing, well developed, well nourished, NAD HEENT:  Atraumatic, normocephalic, oropharynx clear NECK:  Supple without lymphadenopathy or thyromegaly ABDOMEN:  Soft, non-tender, no masses EXTREMITIES:   Moves all extremities well, without clubbing, cyanosis, or edema NEUROLOGIC:  Alert and oriented x 3, normal gait, CN II-XII grossly intact MENTAL STATUS:  appropriate BACK:  Non-tender to palpation, No CVAT SKIN:  Warm, dry, and intact   Results: U/A: 0-5 WBCs, 0-2 RBCs

## 2024-01-31 ENCOUNTER — Ambulatory Visit: Payer: PPO

## 2024-01-31 DIAGNOSIS — I442 Atrioventricular block, complete: Secondary | ICD-10-CM | POA: Diagnosis not present

## 2024-02-02 LAB — CUP PACEART REMOTE DEVICE CHECK
Battery Remaining Longevity: 156 mo
Battery Remaining Percentage: 100 %
Brady Statistic RA Percent Paced: 34 %
Brady Statistic RV Percent Paced: 100 %
Date Time Interrogation Session: 20251006033100
Implantable Lead Connection Status: 753985
Implantable Lead Connection Status: 753985
Implantable Lead Implant Date: 20230627
Implantable Lead Implant Date: 20230627
Implantable Lead Location: 753859
Implantable Lead Location: 753860
Implantable Lead Model: 3830
Implantable Lead Model: 7841
Implantable Lead Serial Number: 1293503
Implantable Pulse Generator Implant Date: 20230627
Lead Channel Impedance Value: 659 Ohm
Lead Channel Impedance Value: 662 Ohm
Lead Channel Pacing Threshold Amplitude: 0.7 V
Lead Channel Pacing Threshold Amplitude: 0.8 V
Lead Channel Pacing Threshold Pulse Width: 0.4 ms
Lead Channel Pacing Threshold Pulse Width: 0.4 ms
Lead Channel Setting Pacing Amplitude: 2 V
Lead Channel Setting Pacing Amplitude: 2.5 V
Lead Channel Setting Pacing Pulse Width: 0.4 ms
Lead Channel Setting Sensing Sensitivity: 2.5 mV
Pulse Gen Serial Number: 585966
Zone Setting Status: 755011

## 2024-02-03 ENCOUNTER — Ambulatory Visit: Payer: Self-pay | Admitting: Internal Medicine

## 2024-02-03 NOTE — Progress Notes (Signed)
 Remote PPM Transmission

## 2024-02-03 NOTE — Progress Notes (Signed)
 [facility] [address], [address] [place], [place] [address] [phone]  Date:  02/09/2024   Name:  Jeff Sheppard   DOB:  08/24/1955   MRN:  [id number]  PCP:  Watt Harlene BROCKS, MD    Chief Complaint: Annual Exam (Decreased hearing in R ear onset a long time )   History of Present Illness:  Jeff Sheppard is a 68 y.o. very pleasant male patient who presents with the following:  Patient seen today for physical exam.  I saw him most recently in December- history of long-term tobacco use, prediabetes, COPD, chronic migraine, pacemaker placed for advanced AV block, prostate cancer We noted an increase in his PSA from our last visit, I referred him to urology unfortunately he did end up getting diagnosed with prostate cancer.  He is seeing Dr. Roseann, for the time being they plan for active surveillance He saw Dr. Roseann for follow-up about 3 weeks ago; they continue to follow his PSAs.  He cannot have a prostate MRI because he has a pacemaker and also cochlear implant  Lung cancer screening CT can be updated Recommend flu shot- he declines  Recommend COVID booster this fall Patient has chosen to not have colon cancer screening.  He has not been able to afford shingles vaccination  Aspirin  81 Topamax  Ubrelvy  as needed for headache  Discussed the use of AI scribe software for clinical note transcription with the patient, who gave verbal consent to proceed.  History of Present Illness Jeff Sheppard is a 68 year old male who presents with a chronic cough and runny nose. He is accompanied by a family member who provides additional information about his symptoms and smoking habits.  He has experienced a persistent cough and runny nose for approximately six months. The cough is present 'ninety percent of the time' and worsened after he resumed smoking. He currently smokes about half a pack of cigarettes per day,  having previously quit for eight months. He attributes his return to smoking to stress related to family issues. He uses an inhaler, which he feels is not strong enough, and he sleeps under a fan, which he believes may contribute to his symptoms.  He has a history of prostate cancer, which is being monitored. He also has a pacemaker, which precludes him from undergoing MRI scans. He mentions having cochlear implants in both ears due to previous issues with bone conduction, and he reports worsening hearing in his right ear despite the implants.  He is currently taking Topamax  daily for headaches and has not needed to use Umbrelvy. He has not received a flu shot and declines one at this visit. He mentions that his right leg swells if he is on his feet for extended periods, a condition he has noticed for three to four years, but it improves overnight and does not cause pain or cramping.  His family member notes that he has been through significant stress due to family issues including the loss of his young granddaughter within the last couple of years   Patient Active Problem List   Diagnosis Date Noted   Prostate cancer [facility]); PSA 4.24; GG 1; low risk 07/06/2023   Elevated PSA 06/10/2023   Migraines 01/01/2022   Chicken pox 01/01/2022   Complete heart block (HCC) 10/20/2021   Prediabetes 07/14/2021   Tobacco abuse 07/14/2021   COPD (chronic obstructive pulmonary disease) (HCC) 07/14/2021    Past Medical  History:  Diagnosis Date   Chicken pox    Migraines     Past Surgical History:  Procedure Laterality Date   APPENDECTOMY     30 years ago   CATARACT EXTRACTION  2018   INNER EAR SURGERY     pt states he could not here   PACEMAKER IMPLANT N/A 10/21/2021   Procedure: PACEMAKER IMPLANT;  Surgeon: Waddell Danelle ORN, MD;  Location: [facility];  Service: Cardiovascular;  Laterality: N/A;    Social History   Tobacco Use   Smoking status: Every Day    Current packs/day: 1.00     Average packs/day: 1 pack/day for 60.0 years (60.0 ttl pk-yrs)    Types: Cigarettes   Smokeless tobacco: Never  Vaping Use   Vaping status: Never Used  Substance Use Topics   Alcohol use: Never   Drug use: Never    Family History  Problem Relation Age of Onset   Cancer Mother    Breast cancer Mother    Early death Mother     No Known Allergies  Medication list has been reviewed and updated.  Current Outpatient Medications on File Prior to Visit  Medication Sig Dispense Refill   aspirin  EC 81 MG tablet Take 1 tablet (81 mg total) by mouth daily. Swallow whole. 30 tablet 12   budesonide-glycopyrrolate-formoterol (BREZTRI  AEROSPHERE) 160-9-4.8 MCG/ACT AERO inhaler Inhale 2 puffs into the lungs in the morning and at bedtime. 32.1 g 3   Ubrogepant  (UBRELVY ) 50 MG TABS TAKE 1 (50 MG TABLET) TABLET BY MOUTH ONCE DAILY AS NEEDED FOR MIGRAINE HEADACHE. MAY  REPEAT  IN  ONE  HOUR.  **MAX  200  MG/24  HOURS** 30 tablet 0   No current facility-administered medications on file prior to visit.    Review of Systems:  As per HPI- otherwise negative.   Physical Examination: Vitals:   02/09/24 0901  BP: 132/80  Pulse: 78  Temp: 97.6 F (36.4 C)  SpO2: 96%   Vitals:   02/09/24 0901  Weight: 159 lb 3.2 oz (72.2 kg)  Height: 5' 7 (1.702 m)   Body mass index is 24.93 kg/m. Ideal Body Weight: Weight in (lb) to have BMI = 25: 159.3  GEN: no acute distress.  Looks well, normal weight HEENT: Atraumatic, Normocephalic.  Cerumen impaction both ears  Ears and Nose: No external deformity. CV: RRR, No M/G/R. No JVD. No thrill. No extra heart sounds. PULM: CTA B, no wheezes, crackles, rhonchi. No retractions. No resp. distress. No accessory muscle use. ABD: S, NT, ND. No rebound. No HSM. EXTR: No c/c/e PSYCH: Normally interactive. Conversant.   Verbal consent obtained.  Warm water irrigation used to clear earwax from left ear.  We removed some wax from right ear, patient noted he felt  better and wished to stop without further irrigation. Assessment and Plan: Physical exam  Prediabetes - Plan: Comprehensive metabolic panel with GFR, Hemoglobin A1c  Prostate cancer (HCC); PSA 4.24; GG 1; low risk  Complete heart block (HCC)  Chronic bronchitis, unspecified chronic bronchitis type (HCC) - Plan: CT CHEST LUNG CA SCREEN LOW DOSE W/O CM, predniSONE  (DELTASONE ) 20 MG tablet  Migraine with aura and without status migrainosus, not intractable - Plan: topiramate  (TOPAMAX ) 25 MG tablet  Screening, lipid - Plan: Lipid panel  Screening for deficiency anemia - Plan: CBC  Assessment & Plan Chronic bronchitis and COPD with tobacco dependence Chronic bronchitis and COPD exacerbated by smoking. Persistent cough and rhinorrhea worsened by smoking. Smoking  cessation discussed but he is not ready to quit. - Prescribe short course of steroids. - Encourage smoking reduction and discuss nicotine patches or gum.  Migraine with aura Migraine with aura well-managed with Topamax . No recent use of Umbrelvy. - Refill Topamax  prescription.  Malignant neoplasm of prostate (active surveillance) Prostate cancer under active surveillance.  Atrioventricular block, complete, with pacemaker Complete heart block managed with pacemaker.  Hearing loss with bilateral cochlear implants and cerumen impaction Bilateral hearing loss with cochlear implants. Right ear hearing worsening, likely due to cerumen impaction. Significant cerumen in both ears. - Perform ear irrigation.  Unilateral chronic leg swelling Chronic unilateral leg swelling for 3-4 years, improves overnight, no pain or cramping.  General Health Maintenance Declined flu vaccination. - Order lung cancer screening CT. Encouraged smoking cessation. Signed Harlene Schroeder, MD  Received labs, message to patient. Results for orders placed or performed in visit on 02/09/24  CBC   Collection Time: 02/09/24 10:09 AM  Result Value Ref  Range   WBC 6.4 4.0 - 10.5 K/uL   RBC 5.35 4.22 - 5.81 Mil/uL   Platelets 213.0 150.0 - 400.0 K/uL   Hemoglobin 15.5 13.0 - 17.0 g/dL   HCT 52.2 60.9 - 47.9 %   MCV 89.1 78.0 - 100.0 fl   MCHC 32.5 30.0 - 36.0 g/dL   RDW 86.0 88.4 - 84.4 %  Comprehensive metabolic panel with GFR   Collection Time: 02/09/24 10:09 AM  Result Value Ref Range   Sodium 141 135 - 145 mEq/L   Potassium 4.2 3.5 - 5.1 mEq/L   Chloride 108 96 - 112 mEq/L   CO2 26 19 - 32 mEq/L   Glucose, Bld 100 (H) 70 - 99 mg/dL   BUN 13 6 - 23 mg/dL   Creatinine, Ser 8.83 0.40 - 1.50 mg/dL   Total Bilirubin 0.3 0.2 - 1.2 mg/dL   Alkaline Phosphatase 48 39 - 117 U/L   AST 16 0 - 37 U/L   ALT 14 0 - 53 U/L   Total Protein 7.0 6.0 - 8.3 g/dL   Albumin 4.4 3.5 - 5.2 g/dL   GFR 35.45 >39.99 mL/min   Calcium 9.4 8.4 - 10.5 mg/dL  Hemoglobin J8r   Collection Time: 02/09/24 10:09 AM  Result Value Ref Range   Hgb A1c MFr Bld 6.0 4.6 - 6.5 %  Lipid panel   Collection Time: 02/09/24 10:09 AM  Result Value Ref Range   Cholesterol 162 0 - 200 mg/dL   Triglycerides 867.9 0.0 - 149.0 mg/dL   HDL 58.09 >60.99 mg/dL   VLDL 73.5 0.0 - 59.9 mg/dL   LDL Cholesterol 94 0 - 99 mg/dL   Total CHOL/HDL Ratio 4    NonHDL 120.50

## 2024-02-03 NOTE — Patient Instructions (Addendum)
 It was great to see you again today!  I will be in touch with your labs Please stop by imaging on the ground floor and set up your CT lung cancer screening test I do recommend a flu shot, covid booster, RSV vaccine   Please see me in about 6 months

## 2024-02-07 NOTE — Progress Notes (Signed)
 Remote PPM Transmission

## 2024-02-08 ENCOUNTER — Ambulatory Visit: Payer: PPO | Admitting: *Deleted

## 2024-02-08 VITALS — BP 122/79 | HR 89 | Temp 98.1°F | Resp 18 | Ht 67.0 in | Wt 159.0 lb

## 2024-02-08 DIAGNOSIS — R918 Other nonspecific abnormal finding of lung field: Secondary | ICD-10-CM

## 2024-02-08 DIAGNOSIS — Z0000 Encounter for general adult medical examination without abnormal findings: Secondary | ICD-10-CM | POA: Diagnosis not present

## 2024-02-08 DIAGNOSIS — Z122 Encounter for screening for malignant neoplasm of respiratory organs: Secondary | ICD-10-CM

## 2024-02-08 NOTE — Progress Notes (Signed)
 Please attest this visit in the absence of patient primary care provider.    Subjective:   Jeff Sheppard is a 68 y.o. who presents for a Medicare Wellness preventive visit.  As a reminder, Annual Wellness Visits don't include a physical exam, and some assessments may be limited, especially if this visit is performed virtually. We may recommend an in-person follow-up visit with your provider if needed.  Visit Complete: In person  Persons Participating in Visit: Patient.  AWV Questionnaire: No: Patient Medicare AWV questionnaire was not completed prior to this visit.  Cardiac Risk Factors include: advanced age (>70men, >17 women);male gender;smoking/ tobacco exposure;Other (see comment), Risk factor comments: COPD, Hx of prostate cancer     Objective:    Today's Vitals   02/08/24 1029  Weight: 159 lb (72.1 kg)  Height: 5' 7 (1.702 m)   Body mass index is 24.9 kg/m.     02/08/2024   10:46 AM 02/04/2023   10:36 AM 12/08/2021   11:15 AM 10/20/2021    8:00 PM  Advanced Directives  Does Patient Have a Medical Advance Directive? No No No No  Would patient like information on creating a medical advance directive? Yes (MAU/Ambulatory/Procedural Areas - Information given) No - Patient declined  No - Patient declined    Current Medications (verified) Outpatient Encounter Medications as of 02/08/2024  Medication Sig   aspirin  EC 81 MG tablet Take 1 tablet (81 mg total) by mouth daily. Swallow whole.   budesonide-glycopyrrolate-formoterol (BREZTRI  AEROSPHERE) 160-9-4.8 MCG/ACT AERO inhaler Inhale 2 puffs into the lungs in the morning and at bedtime.   topiramate  (TOPAMAX ) 25 MG tablet Take 1 tablet (25 mg total) by mouth 2 (two) times daily.   Ubrogepant  (UBRELVY ) 50 MG TABS TAKE 1 (50 MG TABLET) TABLET BY MOUTH ONCE DAILY AS NEEDED FOR MIGRAINE HEADACHE. MAY  REPEAT  IN  ONE  HOUR.  **MAX  200  MG/24  HOURS**   No facility-administered encounter medications on file as of  02/08/2024.    Allergies (verified) Patient has no known allergies.   History: Past Medical History:  Diagnosis Date   Chicken pox    Migraines    Past Surgical History:  Procedure Laterality Date   APPENDECTOMY     30 years ago   CATARACT EXTRACTION  2018   INNER EAR SURGERY     pt states he could not here   PACEMAKER IMPLANT N/A 10/21/2021   Procedure: PACEMAKER IMPLANT;  Surgeon: Waddell Danelle ORN, MD;  Location: [facility];  Service: Cardiovascular;  Laterality: N/A;   Family History  Problem Relation Age of Onset   Cancer Mother    Breast cancer Mother    Early death Mother    Social History   Socioeconomic History   Marital status: Married    Spouse name: Not on file   Number of children: 3   Years of education: 8   Highest education level: Not on file  Occupational History   Not on file  Tobacco Use   Smoking status: Every Day    Current packs/day: 1.00    Average packs/day: 1 pack/day for 60.0 years (60.0 ttl pk-yrs)    Types: Cigarettes   Smokeless tobacco: Never  Vaping Use   Vaping status: Never Used  Substance and Sexual Activity   Alcohol use: Never   Drug use: Never   Sexual activity: Not Currently  Other Topics Concern   Not on file  Social History Narrative  Not on file   Social Drivers of Health   Financial Resource Strain: Medium Risk (02/08/2024)   Overall Financial Resource Strain (CARDIA)    Difficulty of Paying Living Expenses: Somewhat hard  Food Insecurity: No Food Insecurity (02/08/2024)   Hunger Vital Sign    Worried About Running Out of Food in the Last Year: Never true    Ran Out of Food in the Last Year: Never true  Transportation Needs: No Transportation Needs (02/08/2024)   PRAPARE - Administrator, Civil Service (Medical): No    Lack of Transportation (Non-Medical): No  Physical Activity: Sufficiently Active (02/08/2024)   Exercise Vital Sign    Days of Exercise per Week: 7 days    Minutes of  Exercise per Session: 60 min  Stress: No Stress Concern Present (02/08/2024)   [employer] of Occupational Health - Occupational Stress Questionnaire    Feeling of Stress: Not at all  Social Connections: Socially Integrated (02/08/2024)   Social Connection and Isolation Panel    Frequency of Communication with Friends and Family: More than three times a week    Frequency of Social Gatherings with Friends and Family: More than three times a week    Attends Religious Services: More than 4 times per year    Active Member of [employer] or Organizations: No    Attends Engineer, structural: More than 4 times per year    Marital Status: Married    Tobacco Counseling Ready to quit: Not Answered Counseling given: Not Answered    Clinical Intake:  Pre-visit preparation completed: Yes  Pain : No/denies pain     BMI - recorded: 24.9 Nutritional Status: BMI of 19-24  Normal Nutritional Risks: None Diabetes: No  Lab Results  Component Value Date   HGBA1C 5.9 04/26/2023   HGBA1C 6.0 08/12/2022   HGBA1C 6.1 07/14/2021     How often do you need to have someone help you when you read instructions, pamphlets, or other written materials from your doctor or pharmacy?: 1 - Never What is the last grade level you completed in school?: 8th grade  Interpreter Needed?: No  Information entered by :: Lolita Libra, CMA(AAMA)   Activities of Daily Living     02/08/2024   10:39 AM  In your present state of health, do you have any difficulty performing the following activities:  Hearing? 0  Vision? 0  Difficulty concentrating or making decisions? 0  Walking or climbing stairs? 0  Dressing or bathing? 0  Doing errands, shopping? 0  Preparing Food and eating ? N  Using the Toilet? N  In the past six months, have you accidently leaked urine? N  Do you have problems with loss of bowel control? N  Managing your Medications? N  Managing your Finances? N  Housekeeping or  managing your Housekeeping? N    Patient Care Team: Copland, Harlene BROCKS, MD as PCP - General (Family Medicine)  I have updated your Care Teams any recent Medical Services you may have received from other providers in the past year.     Assessment:   This is a routine wellness examination for Jeff Sheppard.  Hearing/Vision screen Hearing Screening - Comments:: Has slight decrease in right ear and will discuss with PCP tomorrow Vision Screening - Comments:: Doesn't have eye doctor, will go to his wife's doctor if he decides to get one.   Goals Addressed   None    Depression Screen     02/08/2024   10:43  AM 02/04/2023   10:34 AM 08/12/2022   10:26 AM 12/08/2021   11:19 AM 07/14/2021   10:20 AM  PHQ 2/9 Scores  PHQ - 2 Score 0 0 0 0 0  PHQ- 9 Score 3 0       Fall Risk     02/08/2024   10:45 AM 02/04/2023   10:36 AM 08/12/2022   10:26 AM 12/08/2021   11:17 AM 07/14/2021   10:20 AM  Fall Risk   Falls in the past year? 0 0 0 0 0  Number falls in past yr: 0 0 0 0 0  Injury with Fall? 0 0 0 0 0  Risk for fall due to : No Fall Risks No Fall Risks No Fall Risks    Follow up Education provided Falls prevention discussed;Falls evaluation completed Falls evaluation completed Falls prevention discussed       Data saved with a previous flowsheet row definition    MEDICARE RISK AT HOME:  Medicare Risk at Home If so, are there any without handrails?: No Home free of loose throw rugs in walkways, pet beds, electrical cords, etc?: Yes Adequate lighting in your home to reduce risk of falls?: Yes Life alert?: No Use of a cane, walker or w/c?: No Grab bars in the bathroom?: Yes Shower chair or bench in shower?: Yes Elevated toilet seat or a handicapped toilet?: No  TIMED UP AND GO:  Was the test performed?  Yes  Length of time to ambulate 10 feet: 8 sec Gait steady and fast without use of assistive device  Cognitive Function: 6CIT completed        02/08/2024   10:47 AM 02/04/2023    10:37 AM  6CIT Screen  What Year? 0 points 0 points  What month? 0 points 0 points  What time? 0 points 0 points  Count back from 20 0 points 0 points  Months in reverse 4 points 4 points  Repeat phrase 6 points 4 points  Total Score 10 points 8 points    Immunizations Immunization History  Administered Date(s) Administered   PFIZER Comirnaty (Gray Top)Covid-19 Tri-Sucrose Vaccine 07/19/2019, 08/09/2019, 03/31/2020   PNEUMOCOCCAL CONJUGATE-20 07/14/2021   Pfizer(Comirnaty )Fall Seasonal Vaccine 12 years and older 02/04/2022, 04/26/2023   Tdap 05/26/2018    Screening Tests Health Maintenance  Topic Date Due   Zoster Vaccines- Shingrix (1 of 2) Never done   Lung Cancer Screening  09/24/2023   Influenza Vaccine  Never done   COVID-19 Vaccine (6 - 2025-26 season) 12/27/2023   Medicare Annual Wellness (AWV)  02/04/2024   Colonoscopy  04/25/2024 (Originally 03/12/2000)   DTaP/Tdap/Td (2 - Td or Tdap) 05/26/2028   Pneumococcal Vaccine: 50+ Years  Completed   Hepatitis C Screening  Completed   Meningococcal B Vaccine  Aged Out    Health Maintenance Items Addressed: Will consider Covid at pharmacy.   Additional Screening:  Vision Screening: Recommended annual ophthalmology exams for early detection of glaucoma and other disorders of the eye. Is the patient up to date with their annual eye exam?  No  Who is the provider or what is the name of the office in which the patient attends annual eye exams? No, will check with wife to see who she sees.  Dental Screening: Recommended annual dental exams for proper oral hygiene  Community Resource Referral / Chronic Care Management: CRR required this visit?  No   CCM required this visit?  No   Plan:    I have personally reviewed and  noted the following in the patient's chart:   Medical and social history Use of alcohol, tobacco or illicit drugs  Current medications and supplements including opioid prescriptions. Patient is not  currently taking opioid prescriptions. Functional ability and status Nutritional status Physical activity Advanced directives List of other physicians Hospitalizations, surgeries, and ER visits in previous 12 months Vitals Screenings to include cognitive, depression, and falls Referrals and appointments  In addition, I have reviewed and discussed with patient certain preventive protocols, quality metrics, and best practice recommendations. A written personalized care plan for preventive services as well as general preventive health recommendations were provided to patient.   Lolita Libra, CMA   02/08/2024   After Visit Summary: (In Person-Printed) AVS printed and given to the patient  Notes: Nothing significant to report at this time.

## 2024-02-08 NOTE — Patient Instructions (Signed)
 Jeff Sheppard , Thank you for taking time out of your busy schedule to complete your Annual Wellness Visit with me. I enjoyed our conversation and look forward to speaking with you again next year. I, as well as your care team,  appreciate your ongoing commitment to your health goals. Please review the following plan we discussed and let me know if I can assist you in the future. Your Game plan/ To Do List    Referrals: If you haven't heard from the office you've been referred to, please reach out to them at the phone provided.   [facility] (Annual CT scan / lung cancer screening): [phone]  Follow up Visits: Next Medicare AWV with our clinical staff:  02/09/25 11am, in person.  Next Office Visit with your provider: 02/09/24 9:20am, Dr Watt.  Clinician Recommendations:  Aim for 30 minutes of exercise or brisk walking, 6-8 glasses of water, and 5 servings of fruits and vegetables each day.   You will need to get the following vaccines at your local pharmacy: Covid if you decide to get it      This is a list of the screening recommended for you and due dates:  Health Maintenance  Topic Date Due   Screening for Lung Cancer  09/24/2023   COVID-19 Vaccine (6 - 2025-26 season) 12/27/2023   Medicare Annual Wellness Visit  02/04/2024   Colon Cancer Screening  04/25/2024*   Flu Shot  07/25/2024*   Zoster (Shingles) Vaccine (1 of 2) 02/07/2025*   DTaP/Tdap/Td vaccine (2 - Td or Tdap) 05/26/2028   Pneumococcal Vaccine for age over 25  Completed   Hepatitis C Screening  Completed   Meningitis B Vaccine  Aged Out  *Topic was postponed. The date shown is not the original due date.    Advanced directives: (Provided) Advance directive discussed with you today. I have provided a copy for you to complete at home and have notarized. Once this is complete, please bring a copy in to our office so we can scan it into your chart.  Advance Care Planning is important because it:  [x]  Makes  sure you receive the medical care that is consistent with your values, goals, and preferences  [x]  It provides guidance to your family and loved ones and reduces their decisional burden about whether or not they are making the right decisions based on your wishes.  Follow the link provided in your after visit summary or read over the paperwork we have mailed to you to help you started getting your Advance Directives in place. If you need assistance in completing these, please reach out to us  so that we can help you!  See attachments for Preventive Care and Fall Prevention Tips.

## 2024-02-09 ENCOUNTER — Ambulatory Visit: Admitting: Family Medicine

## 2024-02-09 ENCOUNTER — Ambulatory Visit

## 2024-02-09 ENCOUNTER — Encounter: Payer: Self-pay | Admitting: Family Medicine

## 2024-02-09 VITALS — BP 132/80 | HR 78 | Temp 97.6°F | Ht 67.0 in | Wt 159.2 lb

## 2024-02-09 DIAGNOSIS — I442 Atrioventricular block, complete: Secondary | ICD-10-CM

## 2024-02-09 DIAGNOSIS — R7303 Prediabetes: Secondary | ICD-10-CM | POA: Diagnosis not present

## 2024-02-09 DIAGNOSIS — C61 Malignant neoplasm of prostate: Secondary | ICD-10-CM | POA: Diagnosis not present

## 2024-02-09 DIAGNOSIS — E785 Hyperlipidemia, unspecified: Secondary | ICD-10-CM

## 2024-02-09 DIAGNOSIS — J42 Unspecified chronic bronchitis: Secondary | ICD-10-CM

## 2024-02-09 DIAGNOSIS — Z13220 Encounter for screening for lipoid disorders: Secondary | ICD-10-CM

## 2024-02-09 DIAGNOSIS — Z130 Encounter for screening for diseases of the blood and blood-forming organs and certain disorders involving the immune mechanism: Secondary | ICD-10-CM

## 2024-02-09 DIAGNOSIS — G43109 Migraine with aura, not intractable, without status migrainosus: Secondary | ICD-10-CM | POA: Diagnosis not present

## 2024-02-09 DIAGNOSIS — Z0000 Encounter for general adult medical examination without abnormal findings: Secondary | ICD-10-CM

## 2024-02-09 LAB — LIPID PANEL
Cholesterol: 162 mg/dL (ref 0–200)
HDL: 41.9 mg/dL (ref >39.00)
LDL Cholesterol: 94 mg/dL (ref 0–99)
NonHDL: 120.5
Total CHOL/HDL Ratio: 4
Triglycerides: 132 mg/dL (ref 0.0–149.0)
VLDL: 26.4 mg/dL (ref 0.0–40.0)

## 2024-02-09 LAB — CBC
HCT: 47.7 % (ref 39.0–52.0)
Hemoglobin: 15.5 g/dL (ref 13.0–17.0)
MCHC: 32.5 g/dL (ref 30.0–36.0)
MCV: 89.1 fl (ref 78.0–100.0)
Platelets: 213 K/uL (ref 150.0–400.0)
RBC: 5.35 Mil/uL (ref 4.22–5.81)
RDW: 13.9 % (ref 11.5–15.5)
WBC: 6.4 K/uL (ref 4.0–10.5)

## 2024-02-09 LAB — COMPREHENSIVE METABOLIC PANEL WITH GFR
ALT: 14 U/L (ref 0–53)
AST: 16 U/L (ref 0–37)
Albumin: 4.4 g/dL (ref 3.5–5.2)
Alkaline Phosphatase: 48 U/L (ref 39–117)
BUN: 13 mg/dL (ref 6–23)
CO2: 26 meq/L (ref 19–32)
Calcium: 9.4 mg/dL (ref 8.4–10.5)
Chloride: 108 meq/L (ref 96–112)
Creatinine, Ser: 1.16 mg/dL (ref 0.40–1.50)
GFR: 64.54 mL/min (ref >60.00)
Glucose, Bld: 100 mg/dL — ABNORMAL HIGH (ref 70–99)
Potassium: 4.2 meq/L (ref 3.5–5.1)
Sodium: 141 meq/L (ref 135–145)
Total Bilirubin: 0.3 mg/dL (ref 0.2–1.2)
Total Protein: 7 g/dL (ref 6.0–8.3)

## 2024-02-09 LAB — HEMOGLOBIN A1C: Hgb A1c MFr Bld: 6 % (ref 4.6–6.5)

## 2024-02-09 MED ORDER — ROSUVASTATIN CALCIUM 20 MG PO TABS: 20.0000 mg | ORAL_TABLET | Freq: Every day | ORAL | 3 refills | Status: AC

## 2024-02-09 MED ORDER — TOPIRAMATE 25 MG PO TABS: 25.0000 mg | ORAL_TABLET | Freq: Two times a day (BID) | ORAL | 3 refills | Status: AC

## 2024-02-09 MED ORDER — PREDNISONE 20 MG PO TABS: ORAL_TABLET | ORAL | 0 refills | Status: DC

## 2024-02-14 ENCOUNTER — Other Ambulatory Visit (INDEPENDENT_AMBULATORY_CARE_PROVIDER_SITE_OTHER): Admitting: Pharmacist

## 2024-02-14 DIAGNOSIS — J42 Unspecified chronic bronchitis: Secondary | ICD-10-CM

## 2024-02-14 NOTE — Progress Notes (Signed)
   02/14/2024 Name: Jeff Sheppard MRN: [id number] DOB: [birth year]  Chief Complaint  Patient presents with   Medication Management    Jeff Sheppard is a 68 y.o. year old male who presented for a telephone visit.   Following up medication management and medication access.    Subjective: Patient was initially started on [name] by Dr Watt 05/06/2023 and was provided samples but cost at his pharmacy was $47 which he states is more than he is able to afford currently.  Reviewed his formulary. There are not any triple therapy options that have a lower copay than $47.  His PCP approved change to Breztri  and we [name]. Patient was approved thru 04/26/2024 and received his first shipment of 3 inhalers in March 2025. His last delivery was 12/2023.   Patient reports affordability concerns with their medications: No - not since he was approved for [name] program.  Patient reports access/transportation concerns to their pharmacy: No  Patient reports adherence concerns with their medications:  No       Medications Reviewed Today     Reviewed by Carla Milling, RPH-CPP (Pharmacist) on 02/14/24 at 0913  Med List Status: <None>   Medication Order Taking? Sig Documenting Provider Last Dose Status Informant  aspirin  EC 81 MG tablet [id number]  Take 1 tablet (81 mg total) by mouth daily. Swallow whole. Lesia Ozell Barter, PA-C  Active   budesonide-glycopyrrolate-formoterol (BREZTRI  AEROSPHERE) 160-9-4.8 MCG/ACT AERO inhaler [id number] Yes Inhale 2 puffs into the lungs in the morning and at bedtime. Copland, Harlene BROCKS, MD  Active   predniSONE  (DELTASONE ) 20 MG tablet [id number] Yes Take 40 mg by mouth daily for 3 days, then 20 mg by mouth daily for 3 days Copland, Harlene BROCKS, MD  Active   rosuvastatin (CRESTOR) 20 MG tablet [id number] Yes Take 1 tablet (20 mg total) by mouth daily. Copland, Harlene BROCKS, MD  Active   topiramate  (TOPAMAX ) 25 MG tablet [id number]  Yes Take 1 tablet (25 mg total) by mouth 2 (two) times daily. Copland, Harlene BROCKS, MD  Active    Patient not taking:   Discontinued 02/14/24 0913 (No longer needed (for PRN medications))               Assessment/Plan:   Medication Management / Access - Continue Breztri  - 2 inhalations into lungs twice a day.  - Patient is in process of renewing Breztri  patient assistance program - will have Med Assist team follow up to make sure he has been approved.   Reviewed medications and refill history. Updated medication list.   Follow up in 2026 to make sure he continues to receive delivery of Breztri  from [facility] and [place]   Milling Carla, PharmD Clinical Pharmacist [facility] Primary Care  Population Health [phone]

## 2024-02-16 ENCOUNTER — Telehealth: Payer: Self-pay

## 2024-02-16 NOTE — Telephone Encounter (Signed)
 PAP: Patient assistance application for Breztri  through [employer] (AZ&Me) has been mailed to pt's home address on file. Provider portion of application will be faxed to provider's office. Patient is Cond approved and consented-faxed provider portion.

## 2024-02-17 NOTE — Telephone Encounter (Signed)
 PAP: Application for Jeff Sheppard has been submitted to [employer] (AZ&Me), via fax

## 2024-02-28 ENCOUNTER — Ambulatory Visit (HOSPITAL_BASED_OUTPATIENT_CLINIC_OR_DEPARTMENT_OTHER)
Admission: RE | Admit: 2024-02-28 | Discharge: 2024-02-28 | Disposition: A | Discharge status: Home / Self Care | Source: Ambulatory Visit | Attending: Family Medicine | Admitting: Family Medicine

## 2024-02-28 ENCOUNTER — Other Ambulatory Visit: Payer: Self-pay | Admitting: Family Medicine

## 2024-02-28 DIAGNOSIS — J439 Emphysema, unspecified: Secondary | ICD-10-CM | POA: Insufficient documentation

## 2024-02-28 DIAGNOSIS — I700 Atherosclerosis of aorta: Secondary | ICD-10-CM | POA: Insufficient documentation

## 2024-02-28 DIAGNOSIS — J42 Unspecified chronic bronchitis: Secondary | ICD-10-CM | POA: Diagnosis not present

## 2024-02-28 DIAGNOSIS — R053 Chronic cough: Secondary | ICD-10-CM

## 2024-02-28 DIAGNOSIS — F17210 Nicotine dependence, cigarettes, uncomplicated: Secondary | ICD-10-CM | POA: Insufficient documentation

## 2024-02-28 DIAGNOSIS — Z122 Encounter for screening for malignant neoplasm of respiratory organs: Secondary | ICD-10-CM | POA: Insufficient documentation

## 2024-02-28 MED ORDER — PROMETHAZINE-DM 6.25-15 MG/5ML PO SYRP: 5.0000 mL | ORAL_SOLUTION | Freq: Four times a day (QID) | ORAL | 0 refills | Status: AC | PRN

## 2024-02-28 MED ORDER — UMECLIDINIUM BROMIDE 62.5 MCG/ACT IN AEPB: 1.0000 | INHALATION_SPRAY | Freq: Every day | RESPIRATORY_TRACT | 12 refills | Status: AC

## 2024-02-28 NOTE — Progress Notes (Signed)
 Pt happened to be in the office today as his wife was here for a visit  They note he continues to cough since our last visit.  He likely has COPD due to smoking history. We did give a course of steroids which helped for a while but then if it did not last He is using breztri  but does not think it is helping; he felt he did better with Spiriva  but unfortunately insurance does not cover this medication. I gave him a different plane anticholinergic inhaler similar to Spiriva  which I think his insurance may cover.  Also gave him some Phenergan DM to use as needed as the cough is keeping him awake.  Advised this may cause sedation Asked him to let me know how the new inhaler works assuming it is affordable  Meds ordered this encounter  Medications   promethazine-dextromethorphan (PROMETHAZINE-DM) 6.25-15 MG/5ML syrup    Sig: Take 5 mLs by mouth 4 (four) times daily as needed for cough.    Dispense:  118 mL    Refill:  0   umeclidinium bromide  (INCRUSE ELLIPTA ) 62.5 MCG/ACT AEPB    Sig: Inhale 1 puff into the lungs daily.    Dispense:  30 each    Refill:  12

## 2024-03-04 ENCOUNTER — Encounter: Payer: Self-pay | Admitting: Family Medicine

## 2024-03-06 ENCOUNTER — Telehealth: Payer: Self-pay | Admitting: Pharmacist

## 2024-03-06 NOTE — Progress Notes (Signed)
   03/06/2024 Name: Jeff Sheppard MRN: [id number] DOB: 10/19/1955  Chief Complaint  Patient presents with   Medication Management    Jeff Sheppard is a 68 y.o. year old male who presented for a telephone visit.   Following up medication management and medication access.    Subjective: Patient was initially started on [name] by Dr Watt 05/06/2023 and was provided samples but cost at his pharmacy was $47 which he states is more than he is able to afford currently.  Reviewed his formulary. There are not any triple therapy options that have a lower copay than $47.  His PCP approved change to Breztri  and we completed application for [name] program. Patient was approved thru 04/26/2024 and received his first shipment of 3 inhalers in March 2025.  Today his wife reports he has been changed form Breztri  inhaler to Incruse.  He is using Incruse inhaler 1 puff once a day. Patient reports that he has seem improvements in his breathing since starting Incruse. He has also taken Spiriva  with [facility] in past but didn't find that Spiriva  was helpful.   Patient reports affordability concerns with their medications: Yes - reports cost of Incruse was $47 and they expect cost will be $71 in 2026.  Patient reports access/transportation concerns to their pharmacy: No  Patient reports adherence concerns with their medications:  No     Assessment/Plan:   Medication Management / Access - Continue Incruse inhaler. Patient would likely qualify for medication assistance program but for Incruse their program requires patient to spend $600 out of pocket before he can apply.   - Will get Medication Assistance Team to un-enroll him for Breztri  medication assistance program.   Will follow up in 2026 to check cost of Incruse and apply when able for medication assistance program.   Madelin Ray, PharmD Clinical Pharmacist [facility] Primary Care  Population Health [phone]

## 2024-03-07 NOTE — Telephone Encounter (Signed)
 Patient has been switched to incruse inhaler and enrollment with AZ&ME has been cancelled.

## 2024-04-08 ENCOUNTER — Other Ambulatory Visit: Payer: Self-pay

## 2024-04-08 ENCOUNTER — Encounter (HOSPITAL_COMMUNITY): Payer: Self-pay

## 2024-04-08 ENCOUNTER — Emergency Department (HOSPITAL_COMMUNITY)

## 2024-04-08 ENCOUNTER — Emergency Department (HOSPITAL_COMMUNITY)
Admission: EM | Admit: 2024-04-08 | Discharge: 2024-04-08 | Disposition: A | Discharge status: Home / Self Care | Source: Ambulatory Visit | Attending: Emergency Medicine | Admitting: Emergency Medicine

## 2024-04-08 ENCOUNTER — Emergency Department (EMERGENCY_DEPARTMENT_HOSPITAL)

## 2024-04-08 DIAGNOSIS — R609 Edema, unspecified: Secondary | ICD-10-CM | POA: Diagnosis not present

## 2024-04-08 DIAGNOSIS — R0602 Shortness of breath: Secondary | ICD-10-CM

## 2024-04-08 DIAGNOSIS — J441 Chronic obstructive pulmonary disease with (acute) exacerbation: Secondary | ICD-10-CM | POA: Insufficient documentation

## 2024-04-08 DIAGNOSIS — Z7982 Long term (current) use of aspirin: Secondary | ICD-10-CM | POA: Insufficient documentation

## 2024-04-08 DIAGNOSIS — R059 Cough, unspecified: Secondary | ICD-10-CM | POA: Insufficient documentation

## 2024-04-08 DIAGNOSIS — J101 Influenza due to other identified influenza virus with other respiratory manifestations: Secondary | ICD-10-CM | POA: Insufficient documentation

## 2024-04-08 DIAGNOSIS — R2242 Localized swelling, mass and lump, left lower limb: Secondary | ICD-10-CM | POA: Insufficient documentation

## 2024-04-08 LAB — CBC WITH DIFFERENTIAL/PLATELET
Abs Immature Granulocytes: 0.02 K/uL (ref 0.00–0.07)
Basophils Absolute: 0 K/uL (ref 0.0–0.1)
Basophils Relative: 0 %
Eosinophils Absolute: 0 K/uL (ref 0.0–0.5)
Eosinophils Relative: 0 %
HCT: 44.3 % (ref 39.0–52.0)
Hemoglobin: 15.4 g/dL (ref 13.0–17.0)
Immature Granulocytes: 0 %
Lymphocytes Relative: 20 %
Lymphs Abs: 1.1 K/uL (ref 0.7–4.0)
MCH: 30.1 pg (ref 26.0–34.0)
MCHC: 34.8 g/dL (ref 30.0–36.0)
MCV: 86.7 fL (ref 80.0–100.0)
Monocytes Absolute: 0.5 K/uL (ref 0.1–1.0)
Monocytes Relative: 10 %
Neutro Abs: 3.8 K/uL (ref 1.7–7.7)
Neutrophils Relative %: 70 %
Platelets: 146 K/uL — ABNORMAL LOW (ref 150–400)
RBC: 5.11 MIL/uL (ref 4.22–5.81)
RDW: 12.5 % (ref 11.5–15.5)
WBC: 5.5 K/uL (ref 4.0–10.5)
nRBC: 0 % (ref 0.0–0.2)

## 2024-04-08 LAB — COMPREHENSIVE METABOLIC PANEL WITH GFR
ALT: 21 U/L (ref 0–44)
AST: 25 U/L (ref 15–41)
Albumin: 3.4 g/dL — ABNORMAL LOW (ref 3.5–5.0)
Alkaline Phosphatase: 43 U/L (ref 38–126)
Anion gap: 8 (ref 5–15)
BUN: 13 mg/dL (ref 8–23)
CO2: 23 mmol/L (ref 22–32)
Calcium: 8.2 mg/dL — ABNORMAL LOW (ref 8.9–10.3)
Chloride: 107 mmol/L (ref 98–111)
Creatinine, Ser: 1.01 mg/dL (ref 0.61–1.24)
GFR, Estimated: >60 mL/min (ref >60)
Glucose, Bld: 105 mg/dL — ABNORMAL HIGH (ref 70–99)
Potassium: 3.7 mmol/L (ref 3.5–5.1)
Sodium: 138 mmol/L (ref 135–145)
Total Bilirubin: 0.5 mg/dL (ref 0.0–1.2)
Total Protein: 6.7 g/dL (ref 6.5–8.1)

## 2024-04-08 LAB — BRAIN NATRIURETIC PEPTIDE: B Natriuretic Peptide: 8.8 pg/mL (ref 0.0–100.0)

## 2024-04-08 LAB — RESP PANEL BY RT-PCR (RSV, FLU A&B, COVID)  RVPGX2
Influenza A by PCR: POSITIVE — AB
Influenza B by PCR: NEGATIVE
Resp Syncytial Virus by PCR: NEGATIVE
SARS Coronavirus 2 by RT PCR: NEGATIVE

## 2024-04-08 LAB — TROPONIN I (HIGH SENSITIVITY): Troponin I (High Sensitivity): 7 ng/L (ref <18)

## 2024-04-08 MED ORDER — PREDNISONE 20 MG PO TABS
60.0000 mg | ORAL_TABLET | Freq: Once | ORAL | Status: AC
  Administered 2024-04-08: 60 mg via ORAL
  Filled 2024-04-08: qty 3

## 2024-04-08 MED ORDER — PREDNISONE 20 MG PO TABS: ORAL_TABLET | ORAL | 0 refills | Status: DC

## 2024-04-08 MED ORDER — IPRATROPIUM-ALBUTEROL 0.5-2.5 (3) MG/3ML IN SOLN
3.0000 mL | Freq: Once | RESPIRATORY_TRACT | Status: AC
  Administered 2024-04-08: 3 mL via RESPIRATORY_TRACT
  Filled 2024-04-08: qty 3

## 2024-04-08 MED ORDER — ALBUTEROL SULFATE HFA 108 (90 BASE) MCG/ACT IN AERS
2.0000 | INHALATION_SPRAY | Freq: Once | RESPIRATORY_TRACT | Status: AC
  Administered 2024-04-08: 2 via RESPIRATORY_TRACT
  Filled 2024-04-08: qty 6.7

## 2024-04-08 NOTE — ED Triage Notes (Signed)
 Sent by [facility] for SOB/CP since Wednesday. 1/10 pain at this time.

## 2024-04-08 NOTE — ED Provider Notes (Signed)
 [place] EMERGENCY DEPARTMENT AT [facility] Provider Note   CSN: [id number] Arrival date & time: 04/08/24  1242     Patient presents with: Shortness of Breath   Jeff Sheppard is a 68 y.o. male.   HPI 68 year old male with a history of COPD presents with shortness of breath.  Felt like he had a cold starting about 4 to 5 days ago.  Has had some cough and diffuse chest pain.  No fevers.  He had a little bit of exertional dyspnea but not bad.  He has chronic left lower extremity swelling but feels like it is a little worse recently.  He went to urgent care and they sent him to the ER. The right leg is not swollen.  Prior to Admission medications  Medication Sig Start Date End Date Taking? Authorizing Provider  predniSONE  (DELTASONE ) 20 MG tablet 2 tabs po daily x 4 days 04/08/24  Yes Freddi Hamilton, MD  aspirin  EC 81 MG tablet Take 1 tablet (81 mg total) by mouth daily. Swallow whole. 10/25/21   Lesia Ozell Barter, PA-C  budesonide-glycopyrrolate-formoterol (BREZTRI  AEROSPHERE) 160-9-4.8 MCG/ACT AERO inhaler Inhale 2 puffs into the lungs in the morning and at bedtime. 01/12/24   Copland, Harlene BROCKS, MD  promethazine -dextromethorphan (PROMETHAZINE -DM) 6.25-15 MG/5ML syrup Take 5 mLs by mouth 4 (four) times daily as needed for cough. 02/28/24   Copland, Harlene BROCKS, MD  rosuvastatin  (CRESTOR ) 20 MG tablet Take 1 tablet (20 mg total) by mouth daily. 02/09/24   Copland, Harlene BROCKS, MD  topiramate  (TOPAMAX ) 25 MG tablet Take 1 tablet (25 mg total) by mouth 2 (two) times daily. 02/09/24   Copland, Harlene BROCKS, MD  umeclidinium bromide  (INCRUSE ELLIPTA ) 62.5 MCG/ACT AEPB Inhale 1 puff into the lungs daily. 02/28/24   Copland, Jessica C, MD    Allergies: Patient has no known allergies.    Review of Systems  Constitutional:  Negative for fever.  Respiratory:  Positive for cough and shortness of breath.   Cardiovascular:  Positive for chest pain and leg swelling.    Updated Vital  Signs BP 122/72   Pulse 81   Temp 97.7 F (36.5 C) (Oral)   Resp (!) 24   Ht 5' 7 (1.702 m)   Wt 69.9 kg   SpO2 98%   BMI 24.12 kg/m   Physical Exam Vitals and nursing note reviewed.  Constitutional:      General: He is not in acute distress.    Appearance: He is well-developed. He is not ill-appearing or diaphoretic.  HENT:     Head: Normocephalic and atraumatic.  Cardiovascular:     Rate and Rhythm: Normal rate and regular rhythm.     Heart sounds: Normal heart sounds.  Pulmonary:     Effort: Pulmonary effort is normal.     Breath sounds: Wheezing (mild, expiratory) present.  Abdominal:     Palpations: Abdomen is soft.     Tenderness: There is no abdominal tenderness.  Musculoskeletal:     Comments: Left lower leg is mildly swollen compared to the right.  Skin:    General: Skin is warm and dry.  Neurological:     Mental Status: He is alert.     (all labs ordered are listed, but only abnormal results are displayed) Labs Reviewed  RESP PANEL BY RT-PCR (RSV, FLU A&B, COVID)  RVPGX2 - Abnormal; Notable for the following components:      Result Value   Influenza A by PCR POSITIVE (*)  All other components within normal limits  CBC WITH DIFFERENTIAL/PLATELET - Abnormal; Notable for the following components:   Platelets 146 (*)    All other components within normal limits  COMPREHENSIVE METABOLIC PANEL WITH GFR - Abnormal; Notable for the following components:   Glucose, Bld 105 (*)    Calcium  8.2 (*)    Albumin 3.4 (*)    All other components within normal limits  BRAIN NATRIURETIC PEPTIDE  TROPONIN I (HIGH SENSITIVITY)    EKG: EKG Interpretation Date/Time:  Saturday April 08 2024 12:52:25 EST Ventricular Rate:  93 PR Interval:  184 QRS Duration:  148 QT Interval:  410 QTC Calculation: 509 R Axis:   78  Text Interpretation: Sinus rhythm with occasional ventricular-paced complexes and Premature supraventricular complexes Left bundle branch block  Abnormal ECG When compared with ECG of 04-Mar-2023 10:39, PREVIOUS ECG IS PRESENT Confirmed by Simon Rea [phone]) on 04/08/2024 1:34:08 PM  Radiology: VAS US  LOWER EXTREMITY VENOUS (DVT) (ONLY [facility] & [facility]) Result Date: 04/08/2024  Lower Venous DVT Study Patient Name:  Jeff Sheppard  Date of Exam:   04/08/2024 Medical Rec #: [id number]         Accession #:    [id number] Date of Birth: 1955-11-20        Patient Gender: M Patient Age:   89 years Exam Location:  [facility] Procedure:      VAS US  LOWER EXTREMITY VENOUS (DVT) Referring Phys: REA SIMON --------------------------------------------------------------------------------  Indications: Edema, and SOB.  Comparison Study: No previous exams Performing Technologist: Jody Hill RVT, RDMS  Examination Guidelines: A complete evaluation includes B-mode imaging, spectral Doppler, color Doppler, and power Doppler as needed of all accessible portions of each vessel. Bilateral testing is considered an integral part of a complete examination. Limited examinations for reoccurring indications may be performed as noted. The reflux portion of the exam is performed with the patient in reverse Trendelenburg.  +-----+---------------+---------+-----------+----------+--------------+ RIGHTCompressibilityPhasicitySpontaneityPropertiesThrombus Aging +-----+---------------+---------+-----------+----------+--------------+ CFV  Full           Yes      Yes                                 +-----+---------------+---------+-----------+----------+--------------+   +---------+---------------+---------+-----------+----------+--------------+ LEFT     CompressibilityPhasicitySpontaneityPropertiesThrombus Aging +---------+---------------+---------+-----------+----------+--------------+ CFV      Full           Yes      Yes                                 +---------+---------------+---------+-----------+----------+--------------+ SFJ      Full                                                         +---------+---------------+---------+-----------+----------+--------------+ FV Prox  Full           Yes      Yes                                 +---------+---------------+---------+-----------+----------+--------------+ FV Mid   Full           Yes      Yes                                 +---------+---------------+---------+-----------+----------+--------------+  FV DistalFull           Yes      Yes                                 +---------+---------------+---------+-----------+----------+--------------+ PFV      Full                                                        +---------+---------------+---------+-----------+----------+--------------+ POP      Full           Yes      Yes                                 +---------+---------------+---------+-----------+----------+--------------+ PTV      Full                                                        +---------+---------------+---------+-----------+----------+--------------+ PERO     Full                                                        +---------+---------------+---------+-----------+----------+--------------+     Summary: RIGHT: - No evidence of common femoral vein obstruction.   LEFT: - There is no evidence of deep vein thrombosis in the lower extremity.  - No cystic structure found in the popliteal fossa.  *See table(s) above for measurements and observations.    Preliminary    DG Chest Portable 1 View Result Date: 04/08/2024 EXAM: 1 VIEW(S) XRAY OF THE CHEST 04/08/2024 02:24:00 PM COMPARISON: Chest x-ray 10/22/2021, CT chest 02/28/2024. 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 LUNGS AND PLEURA: Mild pulmonary edema. No focal pulmonary opacity. No pleural effusion. No pneumothorax. HEART AND MEDIASTINUM: No acute abnormality of the cardiac and mediastinal silhouettes. BONES AND SOFT TISSUES: No  acute osseous abnormality. IMPRESSION: 1. Mild pulmonary edema. Electronically signed by: Morgane Naveau MD 04/08/2024 02:45 PM EST RP Workstation: [id number]     Procedures   Medications Ordered in the ED  ipratropium-albuterol  (DUONEB) 0.5-2.5 (3) MG/3ML nebulizer solution 3 mL (3 mLs Nebulization Given 04/08/24 1417)  predniSONE  (DELTASONE ) tablet 60 mg (60 mg Oral Given 04/08/24 1417)  albuterol  (VENTOLIN  HFA) 108 (90 Base) MCG/ACT inhaler 2 puff (2 puffs Inhalation Given 04/08/24 1551)                                    Medical Decision Making Amount and/or Complexity of Data Reviewed Labs:     Details: Normal WBC.  Influenza A positive Radiology: independent interpretation performed.    Details: No lobar pneumonia ECG/medicine tests: ordered and independent interpretation performed.    Details: No ischemia  Risk Prescription drug management.   Patient presents with respiratory symptoms and leg swelling.  DVT ultrasound  does not show any evidence of a DVT.  X-ray was read as possible pulmonary edema but clinically this does not seem to be the case, especially with a completely normal BNP.  Seems much more likely to have mild COPD exacerbation from influenza.  He is feeling a lot better with the breathing treatment.  Will give steroids and he is out of the window for any type of clinical improvement with Tamiflu (over 48 hours since onset).  He feels well enough for discharge.  He is not hypoxic and feels well enough to go home.  Do not think antibiotics will be helpful.  Will discharge home with return precautions.     Final diagnoses:  Influenza A  COPD exacerbation [facility])    ED Discharge Orders          Ordered    predniSONE  (DELTASONE ) 20 MG tablet        04/08/24 1544               Freddi Hamilton, MD 04/08/24 1553

## 2024-04-08 NOTE — Discharge Instructions (Signed)
 Start the prednisone  prescription tomorrow, you were given the first dose in the emergency department.  Otherwise, make sure you drink an appropriate amount of fluids.  We are giving you an albuterol  inhaler to use, 1-2 puffs every 4 or 6 hours as needed for cough or wheezing or shortness of breath.  Follow-up closely with your primary care provider.  Return to the ER for any new or worsening symptoms.

## 2024-04-08 NOTE — Progress Notes (Signed)
 LLE venous duplex has been completed.  Preliminary results given to Dr. Simon.   Results can be found under chart review under CV PROC. 04/08/2024 3:01 PM Caprina Wussow RVT, RDMS

## 2024-04-08 NOTE — ED Provider Triage Note (Signed)
 Emergency Medicine Provider Triage Evaluation Note  Jeff Sheppard , a 68 y.o. male  was evaluated in triage.  Pt complains of shortness of breath.  Patient states that he woke up Wednesday with a runny nose and a nonproductive cough.  Patient states that since then, has been having some slight shortness of breath.  No chest pain.  No exertional chest pain.  Does endorse some slight exertional shortness of breath.  No orthopnea.  Does endorse left lower extremity swelling but he feels like this is more chronic.SABRA  Review of Systems  Positive: Sob  Negative: Cp  Physical Exam  BP (!) 143/82   Pulse 89   Temp 97.7 F (36.5 C) (Oral)   Resp (!) 24   Ht 5' 7 (1.702 m)   Wt 69.9 kg   SpO2 90%   BMI 24.12 kg/m  Gen:   Awake, no distress   Resp:  Normal effort  MSK:   Moves extremities without difficulty  Other:    Medical Decision Making  Medically screening exam initiated at 1:34 PM.  Appropriate orders placed.  READE TREFZ was informed that the remainder of the evaluation will be completed by another provider, this initial triage assessment does not replace that evaluation, and the importance of remaining in the ED until their evaluation is complete.  Possibly COPD exacerbation.  Obtain COVID RSV and flu.  BNP to assess volume status.  Chest x-ray.  Swelling of the left lower extremity but seems to be more chronic.    Simon Lavonia SAILOR, MD 04/08/24 [phone]

## 2024-04-08 NOTE — ED Triage Notes (Signed)
 Pt c.o sob since Wednesday morning when he woke up with a cough and congestion. Denies congestion today. Hx of COPD, used inhaler with some relief. Pt also c.o left lower leg edema, no hx of CHF

## 2024-05-01 ENCOUNTER — Ambulatory Visit: Payer: PPO

## 2024-05-01 DIAGNOSIS — I442 Atrioventricular block, complete: Secondary | ICD-10-CM | POA: Diagnosis not present

## 2024-05-02 LAB — CUP PACEART REMOTE DEVICE CHECK
Battery Remaining Longevity: 150 mo
Battery Remaining Percentage: 100 %
Brady Statistic RA Percent Paced: 32 %
Brady Statistic RV Percent Paced: 100 %
Date Time Interrogation Session: 20260105033100
Implantable Lead Connection Status: 753985
Implantable Lead Connection Status: 753985
Implantable Lead Implant Date: 20230627
Implantable Lead Implant Date: 20230627
Implantable Lead Location: 753859
Implantable Lead Location: 753860
Implantable Lead Model: 3830
Implantable Lead Model: 7841
Implantable Lead Serial Number: 1293503
Implantable Pulse Generator Implant Date: 20230627
Lead Channel Impedance Value: 603 Ohm
Lead Channel Impedance Value: 610 Ohm
Lead Channel Pacing Threshold Amplitude: 0.6 V
Lead Channel Pacing Threshold Amplitude: 1 V
Lead Channel Pacing Threshold Pulse Width: 0.4 ms
Lead Channel Pacing Threshold Pulse Width: 0.4 ms
Lead Channel Setting Pacing Amplitude: 2 V
Lead Channel Setting Pacing Amplitude: 2.5 V
Lead Channel Setting Pacing Pulse Width: 0.4 ms
Lead Channel Setting Sensing Sensitivity: 2.5 mV
Pulse Gen Serial Number: 585966
Zone Setting Status: 755011

## 2024-05-05 NOTE — Progress Notes (Signed)
 Remote PPM Transmission

## 2024-05-06 ENCOUNTER — Ambulatory Visit: Payer: Self-pay | Admitting: Cardiovascular Disease

## 2024-05-10 ENCOUNTER — Other Ambulatory Visit: Admitting: Pharmacist

## 2024-05-10 NOTE — Progress Notes (Signed)
" ° °  05/10/2024 Name: Jeff Sheppard MRN: [id number] DOB: [birth year]  Chief Complaint  Patient presents with   Medication Management    Jeff Sheppard is a 69 y.o. year old male who presented for a telephone visit.   Following up medication management and medication access.    Subjective: Patient has been using Incruse inhaler. He has mention in the past that he has had difficulty with monthly copay. '  We got Breztri  in the past from patient assistance program in place of Trelelgy when he was on Trelelgy but patient preferred the dry powder inhalers.    Patient reports affordability concerns with their medications: Yes - Patient reports access/transportation concerns to their pharmacy: No  Patient reports adherence concerns with their medications:  No       Medications Reviewed Today     Reviewed by Carla Milling, RPH-CPP (Pharmacist) on 05/10/24 at 0855  Med List Status: <None>   Medication Order Taking? Sig Documenting Provider Last Dose Status Informant  aspirin  EC 81 MG tablet [id number] Yes Take 1 tablet (81 mg total) by mouth daily. Swallow whole. Lesia Ozell Barter, PA-C  Active     Discontinued 05/10/24 [phone] (Patient Preference)    Patient not taking:   Discontinued 05/10/24 0855 (Completed Course)   promethazine -dextromethorphan (PROMETHAZINE -DM) 6.25-15 MG/5ML syrup [id number] Yes Take 5 mLs by mouth 4 (four) times daily as needed for cough. Copland, Harlene BROCKS, MD  Active   rosuvastatin  (CRESTOR ) 20 MG tablet [id number] Yes Take 1 tablet (20 mg total) by mouth daily. Copland, Harlene BROCKS, MD  Active   topiramate  (TOPAMAX ) 25 MG tablet [id number] Yes Take 1 tablet (25 mg total) by mouth 2 (two) times daily. Copland, Harlene BROCKS, MD  Active   umeclidinium bromide  (INCRUSE ELLIPTA ) 62.5 MCG/ACT AEPB [id number] Yes Inhale 1 puff into the lungs daily. Copland, Harlene BROCKS, MD  Active               Assessment/Plan:   Medication Management / Access - Continue Incruse  inhaler - Discussed cost for 2026 - with his [employer] Medicare plan Incruse is tier 3 - cost will be around $71 / month. Patient's wife states that are OK with this cost. Incruse patient assistance program would require that they spend $600 out of pocket before applying for patient assistance program.   Reviewed medications and refill history. Updated medication list.    Milling Carla, PharmD Clinical Pharmacist [facility] Primary Care  Population Health [phone]    "

## 2024-07-05 ENCOUNTER — Other Ambulatory Visit

## 2024-07-12 ENCOUNTER — Ambulatory Visit: Admitting: Urology

## 2025-02-09 ENCOUNTER — Ambulatory Visit
